# Patient Record
Sex: Male | Born: 1995
Health system: Southern US, Community
[De-identification: ages and names within clinical notes are randomized; demographics above are authoritative.]

## PROBLEM LIST (undated history)

## (undated) DIAGNOSIS — E785 Hyperlipidemia, unspecified: Secondary | ICD-10-CM

## (undated) DIAGNOSIS — Z723 Lack of physical exercise: Secondary | ICD-10-CM

## (undated) HISTORY — DX: Hyperlipidemia, unspecified: E78.5

## (undated) HISTORY — PX: WISDOM TOOTH EXTRACTION: SHX21

## (undated) HISTORY — DX: Lack of physical exercise: Z72.3

---
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
## 2013-05-23 ENCOUNTER — Emergency Department (HOSPITAL_COMMUNITY): Payer: BC Managed Care – PPO

## 2013-05-23 ENCOUNTER — Emergency Department (HOSPITAL_COMMUNITY)
Admission: EM | Admit: 2013-05-23 | Discharge: 2013-05-23 | Disposition: A | Discharge status: Home / Self Care | Payer: BC Managed Care – PPO | Attending: Emergency Medicine | Admitting: Emergency Medicine

## 2013-05-23 ENCOUNTER — Encounter (HOSPITAL_COMMUNITY): Payer: Self-pay | Admitting: Emergency Medicine

## 2013-05-23 DIAGNOSIS — S61011A Laceration without foreign body of right thumb without damage to nail, initial encounter: Secondary | ICD-10-CM

## 2013-05-23 DIAGNOSIS — S61209A Unspecified open wound of unspecified finger without damage to nail, initial encounter: Secondary | ICD-10-CM | POA: Insufficient documentation

## 2013-05-23 DIAGNOSIS — Z23 Encounter for immunization: Secondary | ICD-10-CM | POA: Insufficient documentation

## 2013-05-23 DIAGNOSIS — Y92410 Unspecified street and highway as the place of occurrence of the external cause: Secondary | ICD-10-CM | POA: Insufficient documentation

## 2013-05-23 DIAGNOSIS — Y9389 Activity, other specified: Secondary | ICD-10-CM | POA: Insufficient documentation

## 2013-05-23 MED ORDER — TETANUS-DIPHTH-ACELL PERTUSSIS 5-2.5-18.5 LF-MCG/0.5 IM SUSP
0.5000 mL | Freq: Once | INTRAMUSCULAR | Status: AC
  Administered 2013-05-23: 0.5 mL via INTRAMUSCULAR
  Filled 2013-05-23: qty 0.5

## 2013-05-23 MED ORDER — SODIUM CHLORIDE 0.9 % IV BOLUS (SEPSIS)
1000.0000 mL | Freq: Once | INTRAVENOUS | Status: AC
  Administered 2013-05-23: 1000 mL via INTRAVENOUS

## 2013-05-23 NOTE — ED Notes (Signed)
MD at beside

## 2013-05-23 NOTE — ED Notes (Signed)
MD at bedside. 
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
## 2013-05-24 NOTE — ED Provider Notes (Signed)
Medical screening examination/treatment/procedure(s) were conducted as a shared visit with non-physician practitioner(s) and myself.  I personally evaluated the patient during the encounter.  Please see separate note entered by me which describes my patient encounter.    Brandt Loosen, MD 05/24/13 [phone]

## 2013-05-24 NOTE — ED Provider Notes (Signed)
Medical screening examination/treatment/procedure(s) were performed by non-physician practitioner and as supervising physician I was immediately available for consultation/collaboration.     Brandt Loosen, MD 05/24/13 [phone]
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
## 2017-07-05 ENCOUNTER — Other Ambulatory Visit: Payer: Self-pay

## 2017-07-05 ENCOUNTER — Ambulatory Visit (HOSPITAL_COMMUNITY)
Admission: EM | Admit: 2017-07-05 | Discharge: 2017-07-05 | Disposition: A | Discharge status: Home / Self Care | Payer: BLUE CROSS/BLUE SHIELD | Attending: Family Medicine | Admitting: Family Medicine

## 2017-07-05 ENCOUNTER — Encounter (HOSPITAL_COMMUNITY): Payer: Self-pay

## 2017-07-05 DIAGNOSIS — M791 Myalgia, unspecified site: Secondary | ICD-10-CM | POA: Diagnosis not present

## 2017-07-05 DIAGNOSIS — J029 Acute pharyngitis, unspecified: Secondary | ICD-10-CM | POA: Diagnosis not present

## 2017-07-05 LAB — POCT RAPID STREP A: Streptococcus, Group A Screen (Direct): NEGATIVE

## 2017-07-05 MED ORDER — AMOXICILLIN 875 MG PO TABS: 875.0000 mg | ORAL_TABLET | Freq: Two times a day (BID) | ORAL | 0 refills | Status: DC

## 2017-07-05 NOTE — ED Provider Notes (Signed)
  [facility]   [id number] 07/05/17 Arrival Time: 1808   SUBJECTIVE:  Todd Cherry is a 21 y.o. male who presents to the urgent care with complaint of a flu-like illness with the sore throat and myalgias.  States for the last 3 days he has had a sore throat and started having fever and body aches today. Had a fever of 100 at around 5 today.  No significant rhinorrhea and the symptoms began gradually.   History reviewed. No pertinent past medical history. History reviewed. No pertinent family history. Social History   Socioeconomic History  . Marital status: Single    Spouse name: Not on file  . Number of children: Not on file  . Years of education: Not on file  . Highest education level: Not on file  Social Needs  . Financial resource strain: Not on file  . Food insecurity - worry: Not on file  . Food insecurity - inability: Not on file  . Transportation needs - medical: Not on file  . Transportation needs - non-medical: Not on file  Occupational History  . Not on file  Tobacco Use  . Smoking status: Never Smoker  Substance and Sexual Activity  . Alcohol use: Yes    Comment: unsure of amount   . Drug use: No  . Sexual activity: Not on file  Other Topics Concern  . Not on file  Social History Narrative  . Not on file   No outpatient medications have been marked as taking for the 07/05/17 encounter [facility](Hospital Encounter).   No Known Allergies    ROS: As per HPI, remainder of ROS negative.   OBJECTIVE:   Vitals:   07/05/17 1818  BP: 127/76  Pulse: 87  Resp: 16  Temp: 100.2 F (37.9 C)  TempSrc: Oral  SpO2: 99%     General appearance: alert; no distress Eyes: PERRL; EOMI; conjunctiva normal HENT: normocephalic; atraumatic; TMs normal, canal normal, external ears normal without trauma; nasal mucosa normal; oral mucosa normal Neck: supple Lungs: clear to auscultation bilaterally Heart: regular rate and rhythm Back: no CVA  tenderness Extremities: no cyanosis or edema; symmetrical with no gross deformities Skin: warm and dry Neurologic: normal gait; grossly normal Psychological: alert and cooperative; normal mood and affect    Labs:  Results for orders placed or performed during the hospital encounter of 07/05/17  POCT rapid strep [facility]  Result Value Ref Range   Streptococcus, Group A Screen (Direct) NEGATIVE NEGATIVE    Labs Reviewed  CULTURE, GROUP A STREP [facility])  POCT RAPID STREP A      ASSESSMENT & PLAN:  1. Pharyngitis, unspecified etiology     Meds ordered this encounter  Medications  . amoxicillin (AMOXIL) 875 MG tablet    Sig: Take 1 tablet (875 mg total) by mouth 2 (two) times daily.    Dispense:  20 tablet    Refill:  0    Reviewed expectations re: course of current medical issues. Questions answered. Outlined signs and symptoms indicating need for more acute intervention. Patient verbalized understanding. After Visit Summary given.    Procedures:      Elvina SidleLauenstein, Harlyn Rathmann, MD 07/05/17 [phone]

## 2017-07-05 NOTE — ED Triage Notes (Signed)
Pt presents today with flu like sxs. States for the last 3 days he has had a sore throat and started having fever and body aches today. Had a fever of 100 at around 5 today.

## 2017-07-05 NOTE — Discharge Instructions (Signed)
The initial strep test that we do here in the office is negative.  We going to send out a more accurate throat culture in the meantime I want you to start on the antibiotic.  We will call you once we have the results of the more accurate strep test.

## 2017-07-08 LAB — CULTURE, GROUP A STREP (THRC)

## 2019-03-10 ENCOUNTER — Other Ambulatory Visit: Payer: Self-pay

## 2019-03-10 ENCOUNTER — Ambulatory Visit: Payer: 59 | Admitting: Registered Nurse

## 2019-03-10 ENCOUNTER — Encounter: Payer: Self-pay | Admitting: Registered Nurse

## 2019-03-10 DIAGNOSIS — T63441A Toxic effect of venom of bees, accidental (unintentional), initial encounter: Secondary | ICD-10-CM | POA: Insufficient documentation

## 2019-03-10 MED ORDER — ACETAMINOPHEN 500 MG PO TABS: 1000.0000 mg | ORAL_TABLET | Freq: Four times a day (QID) | ORAL | 0 refills | Status: AC | PRN

## 2019-03-10 MED ORDER — AMOXICILLIN-POT CLAVULANATE 875-125 MG PO TABS: 1.0000 | ORAL_TABLET | Freq: Two times a day (BID) | ORAL | 0 refills | Status: AC

## 2019-03-10 MED ORDER — DIPHENHYDRAMINE HCL 50 MG PO TABS: 50.0000 mg | ORAL_TABLET | Freq: Every evening | ORAL | 0 refills | Status: DC | PRN

## 2019-03-10 MED ORDER — IBUPROFEN 800 MG PO TABS: 800.0000 mg | ORAL_TABLET | Freq: Three times a day (TID) | ORAL | 0 refills | Status: DC

## 2019-03-10 MED ORDER — HYDROCORTISONE 1 % EX LOTN: 1.0000 "application " | TOPICAL_LOTION | Freq: Two times a day (BID) | CUTANEOUS | 0 refills | Status: AC

## 2019-03-10 MED ORDER — DIPHENHYDRAMINE HCL 2 % EX GEL: 1.0000 "application " | Freq: Two times a day (BID) | CUTANEOUS | Status: AC | PRN

## 2019-03-10 MED ORDER — IBUPROFEN 200 MG PO TABS
800.0000 mg | ORAL_TABLET | Freq: Once | ORAL | Status: AC
  Administered 2019-03-10: 16:00:00 800 mg via ORAL

## 2019-03-10 MED ORDER — PREDNISONE 10 MG (21) PO TBPK: ORAL_TABLET | ORAL | 0 refills | Status: DC

## 2019-03-10 MED ORDER — METHYLPREDNISOLONE ACETATE 80 MG/ML IJ SUSP
80.0000 mg | Freq: Once | INTRAMUSCULAR | Status: AC
  Administered 2019-03-10: 16:00:00 80 mg via INTRAMUSCULAR

## 2019-03-10 NOTE — Patient Instructions (Signed)
Cellulitis, Adult  Cellulitis is a skin infection. The infected area is usually warm, red, swollen, and tender. This condition occurs most often in the arms and lower legs. The infection can travel to the muscles, blood, and underlying tissue and become serious. It is very important to get treated for this condition. What are the causes? Cellulitis is caused by bacteria. The bacteria enter through a break in the skin, such as a cut, burn, insect bite, open sore, or crack. What increases the risk? This condition is more likely to occur in people who:  Have a weak body defense system (immune system).  Have open wounds on the skin, such as cuts, burns, bites, and scrapes. Bacteria can enter the body through these open wounds.  Are older than 25 years of age.  Have diabetes.  Have a type of long-lasting (chronic) liver disease (cirrhosis) or kidney disease.  Are obese.  Have a skin condition such as: ? Itchy rash (eczema). ? Slow movement of blood in the veins (venous stasis). ? Fluid buildup below the skin (edema).  Have had radiation therapy.  Use IV drugs. What are the signs or symptoms? Symptoms of this condition include:  Redness, streaking, or spotting on the skin.  Swollen area of the skin.  Tenderness or pain when an area of the skin is touched.  Warm skin.  A fever.  Chills.  Blisters. How is this diagnosed? This condition is diagnosed based on a medical history and physical exam. You may also have tests, including:  Blood tests.  Imaging tests. How is this treated? Treatment for this condition may include:  Medicines, such as antibiotic medicines or medicines to treat allergies (antihistamines).  Supportive care, such as rest and application of cold or warm cloths (compresses) to the skin.  Hospital care, if the condition is severe. The infection usually starts to get better within 1-2 days of treatment. Follow these instructions at home:  Medicines   Take over-the-counter and prescription medicines only as told by your health care provider.  If you were prescribed an antibiotic medicine, take it as told by your health care provider. Do not stop taking the antibiotic even if you start to feel better. General instructions  Drink enough fluid to keep your urine pale yellow.  Do not touch or rub the infected area.  Raise (elevate) the infected area above the level of your heart while you are sitting or lying down.  Apply warm or cold compresses to the affected area as told by your health care provider.  Keep all follow-up visits as told by your health care provider. This is important. These visits let your health care provider make sure a more serious infection is not developing. Contact a health care provider if:  You have a fever.  Your symptoms do not begin to improve within 1-2 days of starting treatment.  Your bone or joint underneath the infected area becomes painful after the skin has healed.  Your infection returns in the same area or another area.  You notice a swollen bump in the infected area.  You develop new symptoms.  You have a general ill feeling (malaise) with muscle aches and pains. Get help right away if:  Your symptoms get worse.  You feel very sleepy.  You develop vomiting or diarrhea that persists.  You notice red streaks coming from the infected area.  Your red area gets larger or turns dark in color. These symptoms may represent a serious problem that is an  emergency. Do not wait to see if the symptoms will go away. Get medical help right away. Call your local emergency services (911 in the [place] Do not drive yourself to the hospital. Summary  Cellulitis is a skin infection. This condition occurs most often in the arms and lower legs.  Treatment for this condition may include medicines, such as antibiotic medicines or antihistamines.  Take over-the-counter and prescription medicines only as  told by your health care provider. If you were prescribed an antibiotic medicine, do not stop taking the antibiotic even if you start to feel better.  Contact a health care provider if your symptoms do not begin to improve within 1-2 days of starting treatment or your symptoms get worse.  Keep all follow-up visits as told by your health care provider. This is important. These visits let your health care provider make sure that a more serious infection is not developing. This information is not intended to replace advice given to you by your health care provider. Make sure you discuss any questions you have with your health care provider. Document Released: 04/10/2005 Document Revised: 11/20/2017 Document Reviewed: 11/20/2017 Elsevier Patient Education  [place] [place], or [employer], Adult Bees, wasps, and hornets are part of a family of insects that can sting people. These stings can cause pain and inflammation, but they are usually not serious. However, some people may have an allergic reaction to a sting. This can cause the symptoms to be more severe. What increases the risk? You may be at a greater risk of getting stung if you:  Provoke a stinging insect by swatting or disturbing it.  Wear strong-smelling soaps, deodorants, or body sprays.  Spend time outdoors near gardens with flowers or fruit trees or in clothes that expose skin.  Eat or drink outside. What are the signs or symptoms? Common symptoms of this condition include:  A red lump in the skin that sometimes has a tiny hole in the center. In some cases, a stinger may be in the center of the wound.  Pain and itching at the sting site.  Redness and swelling around the sting site. If you have an allergic reaction (localized allergic reaction), the swelling and redness may spread out from the sting site. In some cases, this reaction can continue to develop over the next 24-48 hours. In rare cases, a person may have  a severe allergic reaction (anaphylactic reaction) to a sting. Symptoms of an anaphylactic reaction may include:  Wheezing or difficulty breathing.  Raised, itchy, red patches on the skin (hives).  Nausea or vomiting.  Abdominal cramping.  Diarrhea.  Tightness in the chest or chest pain.  Dizziness or fainting.  Redness of the face (flushing).  Hoarse voice.  Swollen tongue, lips, or face. How is this diagnosed? This condition is usually diagnosed based on your symptoms and medical history as well as a physical exam. You may have an allergy test to determine if you are allergic to the substance that the insect injected during the sting (venom). How is this treated? If you were stung by a bee, the stinger and a small sac of venom may be in the wound. It is important to remove the stinger as soon as possible. You can do this by brushing across the wound with gauze, a fingernail, or a flat card such as a credit card. Removing the stinger can help reduce the severity of your body's reaction to the sting. Most stings can be treated with:  Icing to reduce swelling in the area.  Medicines (antihistamines) to treat itching or an allergic reaction.  Medicines to help reduce pain. These may be medicines that you take by mouth, or medicated creams or lotions that you apply to your skin. Pay close attention to your symptoms after you have been stung. If possible, have someone stay with you to make sure you do not have an allergic reaction. If you have any signs of an allergic reaction, call your health care provider. If you have ever had a severe allergic reaction, your health care provider may give you an inhaler or injectable medicine (epinephrine auto-injector) to use if necessary. Follow these instructions at home:   Wash the sting site 2-3 times each day with soap and water as told by your health care provider.  Apply or take over-the-counter and prescription medicines only as told by  your health care provider.  If directed, apply ice to the sting area. ? Put ice in a plastic bag. ? Place a towel between your skin and the bag. ? Leave the ice on for 20 minutes, 2-3 times a day.  Do not scratch the sting area.  If you had a severe allergic reaction to a sting, you may need: ? To wear a medical bracelet or necklace that lists the allergy. ? To learn when and how to use an anaphylaxis kit or epinephrine injection. Your family members and coworkers may also need to learn this. ? To carry an anaphylaxis kit or epinephrine injection with you at all times. How is this prevented?  Avoid swatting at stinging insects and disturbing insect nests.  Do not use fragrant soaps or lotions.  Wear shoes, pants, and long sleeves when spending time outdoors, especially in grassy areas where stinging insects are common.  Keep outdoor areas free from nests or hives.  Keep food and drink containers covered when eating outdoors.  Avoid working or sitting near [employer], if possible.  Wear gloves if you are gardening or working outdoors.  If an attack by a stinging insect or a swarm seems likely in the moment, move away from the area or find a barrier between you and the insect(s), such as a door. Contact a health care provider if:  Your symptoms do not get better in 2-3 days.  You have redness, swelling, or pain that spreads beyond the area of the sting.  You have a fever. Get help right away if: You have symptoms of a severe allergic reaction. These include:  Wheezing or difficulty breathing.  Tightness in the chest or chest pain.  Light-headedness or fainting.  Itchy, raised, red patches on the skin.  Nausea or vomiting.  Abdominal cramping.  Diarrhea.  A swollen tongue or lips, or trouble swallowing.  Dizziness or fainting. Summary  Stings from bees, wasps, and hornets can cause pain and inflammation, but they are usually not serious. However, some  people may have an allergic reaction to a sting. This can cause the symptoms to be more severe.  Pay close attention to your symptoms after you have been stung. If possible, have someone stay with you to make sure you do not have an allergic reaction.  Call your health care provider if you have any signs of an allergic reaction. This information is not intended to replace advice given to you by your health care provider. Make sure you discuss any questions you have with your health care provider. Document Released: 07/01/2005 Document Revised: 06/26/2017 Document Reviewed: 09/05/2016  Elsevier Patient Education  [employer].

## 2019-03-10 NOTE — Progress Notes (Signed)
Subjective:    Patient ID: Todd Cherry, male    DOB: Jan 05, 1996, 23 y.o.   MRN: [id number]  23y/o married caucasian male established to clinic new to me here for evaluation of hornet bites left wrist.  Swollen painful red up to his elbow tried hydrocortisone cream, benadryl, motrin and benadryl gel.  Not sure of mg on motrin or benadryl.  Last dose benadryl last night and motrin this am.   Helped with itching but not swelling/redness.  Denied fever/chills/dyspnea/fatigue/headache/purulent discharge/n/v/d/chest pain/visual changes or tongue swelling.     Review of Systems  Constitutional: Negative for activity change, appetite change, chills, diaphoresis, fatigue and fever.  HENT: Negative for facial swelling, trouble swallowing and voice change.   Eyes: Negative for photophobia and visual disturbance.  Respiratory: Negative for cough, choking, chest tightness, shortness of breath, wheezing and stridor.   Cardiovascular: Negative for chest pain and palpitations.  Gastrointestinal: Negative for abdominal pain, diarrhea, nausea and vomiting.  Endocrine: Negative for cold intolerance and heat intolerance.  Genitourinary: Negative for difficulty urinating.  Musculoskeletal: Negative for arthralgias, back pain, gait problem, joint swelling, myalgias, neck pain and neck stiffness.  Skin: Positive for color change and rash. Negative for pallor and wound.  Allergic/Immunologic: Negative for environmental allergies, food allergies and immunocompromised state.  Neurological: Negative for dizziness, tremors, seizures, syncope, facial asymmetry, speech difficulty, weakness, light-headedness, numbness and headaches.  Hematological: Negative for adenopathy. Does not bruise/bleed easily.  Psychiatric/Behavioral: Negative for agitation, confusion and sleep disturbance.       Objective:   Physical Exam Vitals signs and nursing note reviewed.  Constitutional:      General: He is awake. He is not in  acute distress.    Appearance: Normal appearance. He is well-developed, well-groomed and normal weight. He is not ill-appearing, toxic-appearing or diaphoretic.  HENT:     Head: Normocephalic and atraumatic.     Jaw: There is normal jaw occlusion.     Salivary Glands: Right salivary gland is not diffusely enlarged or tender. Left salivary gland is not diffusely enlarged or tender.     Right Ear: Hearing and external ear normal.     Left Ear: Hearing and external ear normal.     Nose: Nose normal.     Right Sinus: No maxillary sinus tenderness or frontal sinus tenderness.     Left Sinus: No maxillary sinus tenderness or frontal sinus tenderness.     Mouth/Throat:     Lips: Pink. No lesions.     Mouth: Mucous membranes are moist. No injury, lacerations, oral lesions or angioedema.     Tongue: No lesions.     Pharynx: Oropharynx is clear. Uvula midline. No pharyngeal swelling, oropharyngeal exudate, posterior oropharyngeal erythema or uvula swelling.  Eyes:     General: Lids are normal. Vision grossly intact. Gaze aligned appropriately. No visual field deficit or scleral icterus.       Right eye: No discharge.        Left eye: No discharge.     Extraocular Movements: Extraocular movements intact.     Conjunctiva/sclera: Conjunctivae normal.     Pupils: Pupils are equal, round, and reactive to light.  Neck:     Musculoskeletal: Normal range of motion and neck supple. Normal range of motion. No edema, erythema, neck rigidity, crepitus, injury, pain with movement, torticollis, spinous process tenderness or muscular tenderness.     Vascular: No carotid bruit.     Trachea: Trachea and phonation normal. No tracheal tenderness or tracheal  deviation.  Cardiovascular:     Rate and Rhythm: Normal rate and regular rhythm.     Pulses: Normal pulses.          Radial pulses are 2+ on the right side and 2+ on the left side.     Heart sounds: Normal heart sounds.  Pulmonary:     Effort: Pulmonary  effort is normal. No respiratory distress.     Breath sounds: Normal breath sounds and air entry. No stridor, decreased air movement or transmitted upper airway sounds. No decreased breath sounds, wheezing, rhonchi or rales.     Comments: Wearing cloth mask due to covid 19 pandemic; spoke full sentences without difficulty; no cough observed in exam room Chest:     Chest wall: No tenderness.  Abdominal:     General: Abdomen is flat.     Palpations: Abdomen is soft.  Musculoskeletal: Normal range of motion.        General: Swelling and tenderness present. No deformity.     Right shoulder: Normal.     Left shoulder: Normal.     Right elbow: Normal.    Left elbow: Normal.     Right wrist: Normal.     Left wrist: He exhibits tenderness and swelling. He exhibits normal range of motion, no bony tenderness, no effusion, no crepitus, no deformity and no laceration.     Right hip: Normal.     Left hip: Normal.     Right knee: Normal.     Left knee: Normal.     Cervical back: Normal.     Thoracic back: Normal.     Lumbar back: Normal.     Right upper arm: Normal.     Left upper arm: Normal.     Right forearm: Normal.     Left forearm: He exhibits tenderness, swelling and edema. He exhibits no bony tenderness, no deformity and no laceration.       Arms:     Right hand: Normal.     Left hand: Normal. He exhibits normal range of motion, no tenderness, no bony tenderness, normal two-point discrimination, normal capillary refill, no deformity, no laceration and no swelling. Normal sensation noted. Normal strength noted. He exhibits no finger abduction, no thumb/finger opposition and no wrist extension trouble.     Right lower leg: No edema.     Left lower leg: No edema.  Lymphadenopathy:     Head:     Right side of head: No submental, submandibular, tonsillar, preauricular, posterior auricular or occipital adenopathy.     Left side of head: No submental, submandibular, tonsillar, preauricular,  posterior auricular or occipital adenopathy.     Cervical: No cervical adenopathy.     Right cervical: No superficial, deep or posterior cervical adenopathy.    Left cervical: No superficial, deep or posterior cervical adenopathy.  Skin:    General: Skin is warm and dry.     Capillary Refill: Capillary refill takes less than 2 seconds.     Coloration: Skin is not ashen, cyanotic, jaundiced, mottled, pale or sallow.     Findings: Abrasion, erythema and rash present. No abscess, acne, bruising, burn, ecchymosis, laceration, lesion, petechiae or wound. Rash is macular and papular. Rash is not crusting, nodular, purpuric, pustular, scaling, urticarial or vesicular.     Nails: There is no clubbing.   Neurological:     General: No focal deficit present.     Mental Status: He is alert and oriented to person, place, and time. Mental  status is at baseline.     GCS: GCS eye subscore is 4. GCS verbal subscore is 5. GCS motor subscore is 6.     Cranial Nerves: Cranial nerves are intact. No cranial nerve deficit, dysarthria or facial asymmetry.     Sensory: Sensation is intact. No sensory deficit.     Motor: Motor function is intact. No weakness, tremor, atrophy, abnormal muscle tone or seizure activity.     Coordination: Coordination is intact. Coordination normal.     Gait: Gait is intact. Gait normal.     Comments: Gait sure and steady in hallway; on/off exam table and in/out of chair without difficulty; bilateral hand grasp and upper/lower extremity strength equal 5/5  Psychiatric:        Attention and Perception: Attention and perception normal.        Mood and Affect: Mood and affect normal.        Speech: Speech normal.        Behavior: Behavior normal. Behavior is cooperative.        Thought Content: Thought content normal.        Cognition and Memory: Cognition and memory normal.        Judgment: Judgment normal.           Assessment & Plan:  A-insect sting initial  encounter  P-depomedrol 80mg  IM x 1; motrin 800mg  po x 1 in clinic administered by Charm Barges RN.  Patient stayed for 15 minutes after injection to ensure no side effects.  Drank 8 oz water without difficulty.  Has motrin, tylenol, benadryl gel and hydrocortisone cream at home already.  Electronic Rx for prednisone taper 10mg  po daily with breakfast start 03/12/2019 (60/50/40/30/20/10) #21 RF0 discussed possible side effects increased heart rate, BP and insomnia.   augmentin 875mg  po BID x 7-14 days #28 RF0. Electronic Rx to his pharmacy of choice.  May alternate tylenol and motrin if motrin not sufficient for pain/swelling.  Ibuprofen 800mg  po TID prn pain.  Tylenol 1000mg  po QID prn pain.  Call at 7 days to update [place] on symptoms to determine if he requires full 14 day course--Dr Dorris Fetch may require follow up appt.  Discussed Pepcid OTC could also help if swelling not responsive to motrin/steroids/augmentin/benadryl.  If despite 48 hours of antibiotics and steroids pain/rash/redness/worsening or new fever/ discharge he is to be reevaluated by a provider same day.  ER if redness extends to shoulder/trouble breathing/chest pain. Apply benadryl gel twice a day prn itching.  Hydrocortisone 1% lotion apply BID prn itching may alternate with benadryl.  Caution benadryl can cause drowsiness.  Avoid scratching.  May apply ice 15 minutes po TID prn pain/swelling.  Exitcare handout on skin infection and insect bite. RTC if worsening erythema, pain, purulent discharge, fever.  Wash area with soap and water daily do not scrub or be aggressive on affected skin Exitcare handout printed and given to patient on hornet sting and cellulitis.  Patient stated he felt confident can perform  His job duties and rash/swelling doesn't interfere with his ability to perform job.  Patient verbalized understanding, agreed with plan of care and had no further questions at this time.

## 2019-03-10 NOTE — Progress Notes (Signed)
2 black hornets stung left arm near the wrist last night.  Woke up with swelling to the left arm.  States it's progressively worsened throughout the day.  Left arm swollen from wrist to elbow.  Reddened & warm to the touch.  States sore. Took Benadryl last night, Advil this morning & cortisone cream on it today.  AMD

## 2019-05-18 ENCOUNTER — Telehealth: Payer: Managed Care, Other (non HMO) | Admitting: Nurse Practitioner

## 2019-05-18 DIAGNOSIS — J0100 Acute maxillary sinusitis, unspecified: Secondary | ICD-10-CM | POA: Diagnosis not present

## 2019-05-18 MED ORDER — AMOXICILLIN-POT CLAVULANATE 875-125 MG PO TABS: 1.0000 | ORAL_TABLET | Freq: Two times a day (BID) | ORAL | 0 refills | Status: DC

## 2019-05-18 NOTE — Progress Notes (Signed)

## 2019-07-30 ENCOUNTER — Ambulatory Visit: Payer: Managed Care, Other (non HMO) | Attending: Internal Medicine

## 2019-07-30 ENCOUNTER — Other Ambulatory Visit: Payer: Self-pay

## 2019-07-30 DIAGNOSIS — Z20822 Contact with and (suspected) exposure to covid-19: Secondary | ICD-10-CM

## 2019-07-31 LAB — NOVEL CORONAVIRUS, NAA: SARS-CoV-2, NAA: NOT DETECTED

## 2020-02-14 NOTE — Progress Notes (Signed)
Scheduled to complete physical 02/28/20 with Dr. Mayford Knife.  AMD

## 2020-02-15 ENCOUNTER — Other Ambulatory Visit: Payer: Self-pay

## 2020-02-15 ENCOUNTER — Ambulatory Visit: Payer: Self-pay

## 2020-02-15 DIAGNOSIS — Z0000 Encounter for general adult medical examination without abnormal findings: Secondary | ICD-10-CM

## 2020-02-15 LAB — POCT URINALYSIS DIPSTICK
Bilirubin, UA: NEGATIVE
Blood, UA: NEGATIVE
Glucose, UA: NEGATIVE
Ketones, UA: NEGATIVE
Leukocytes, UA: NEGATIVE
Nitrite, UA: NEGATIVE
Protein, UA: NEGATIVE
Spec Grav, UA: 1.01 (ref 1.010–1.025)
Urobilinogen, UA: 0.2 E.U./dL
pH, UA: 6 (ref 5.0–8.0)

## 2020-02-16 LAB — CMP12+LP+TP+TSH+6AC+CBC/D/PLT

## 2020-02-18 ENCOUNTER — Other Ambulatory Visit: Payer: Self-pay

## 2020-02-18 DIAGNOSIS — Z0000 Encounter for general adult medical examination without abnormal findings: Secondary | ICD-10-CM

## 2020-02-19 LAB — CMP12+LP+TP+TSH+6AC+CBC/D/PLT
ALT: 31 IU/L (ref 0–44)
AST: 22 IU/L (ref 0–40)
Albumin/Globulin Ratio: 2.6 — ABNORMAL HIGH (ref 1.2–2.2)
Albumin: 4.9 g/dL (ref 4.1–5.2)
Alkaline Phosphatase: 51 IU/L (ref 48–121)
BUN/Creatinine Ratio: 11 (ref 9–20)
BUN: 11 mg/dL (ref 6–20)
Basophils Absolute: 0.1 10*3/uL (ref 0.0–0.2)
Basos: 1 %
Bilirubin Total: 1.2 mg/dL (ref 0.0–1.2)
Calcium: 9.5 mg/dL (ref 8.7–10.2)
Chloride: 100 mmol/L (ref 96–106)
Chol/HDL Ratio: 6 ratio — ABNORMAL HIGH (ref 0.0–5.0)
Cholesterol, Total: 254 mg/dL — ABNORMAL HIGH (ref 100–199)
Creatinine, Ser: 0.97 mg/dL (ref 0.76–1.27)
EOS (ABSOLUTE): 0.1 10*3/uL (ref 0.0–0.4)
Eos: 1 %
Estimated CHD Risk: 1.3 times avg. — ABNORMAL HIGH (ref 0.0–1.0)
Free Thyroxine Index: 1.6 (ref 1.2–4.9)
GFR calc Af Amer: 124 mL/min/{1.73_m2} (ref >59)
GFR calc non Af Amer: 107 mL/min/{1.73_m2} (ref >59)
GGT: 41 IU/L (ref 0–65)
Globulin, Total: 1.9 g/dL (ref 1.5–4.5)
Glucose: 90 mg/dL (ref 65–99)
HDL: 42 mg/dL (ref >39)
Hematocrit: 45 % (ref 37.5–51.0)
Hemoglobin: 15.6 g/dL (ref 13.0–17.7)
Immature Grans (Abs): 0 10*3/uL (ref 0.0–0.1)
Immature Granulocytes: 1 %
Iron: 159 ug/dL (ref 38–169)
LDH: 151 IU/L (ref 121–224)
LDL Chol Calc (NIH): 185 mg/dL — ABNORMAL HIGH (ref 0–99)
Lymphocytes Absolute: 1.5 10*3/uL (ref 0.7–3.1)
Lymphs: 37 %
MCH: 30.1 pg (ref 26.6–33.0)
MCHC: 34.7 g/dL (ref 31.5–35.7)
MCV: 87 fL (ref 79–97)
Monocytes Absolute: 0.4 10*3/uL (ref 0.1–0.9)
Monocytes: 9 %
Neutrophils Absolute: 2.1 10*3/uL (ref 1.4–7.0)
Neutrophils: 51 %
Phosphorus: 3.6 mg/dL (ref 2.8–4.1)
Platelets: 178 10*3/uL (ref 150–450)
Potassium: 4.4 mmol/L (ref 3.5–5.2)
RBC: 5.19 x10E6/uL (ref 4.14–5.80)
RDW: 12.6 % (ref 11.6–15.4)
Sodium: 137 mmol/L (ref 134–144)
T3 Uptake Ratio: 25 % (ref 24–39)
T4, Total: 6.3 ug/dL (ref 4.5–12.0)
TSH: 1.39 u[IU]/mL (ref 0.450–4.500)
Total Protein: 6.8 g/dL (ref 6.0–8.5)
Triglycerides: 145 mg/dL (ref 0–149)
Uric Acid: 6.6 mg/dL (ref 3.8–8.4)
VLDL Cholesterol Cal: 27 mg/dL (ref 5–40)
WBC: 4 10*3/uL (ref 3.4–10.8)

## 2020-02-28 ENCOUNTER — Encounter: Payer: Self-pay | Admitting: Emergency Medicine

## 2020-02-28 ENCOUNTER — Other Ambulatory Visit: Payer: Self-pay

## 2020-02-28 ENCOUNTER — Ambulatory Visit: Payer: Self-pay | Admitting: Emergency Medicine

## 2020-02-28 VITALS — BP 130/90 | HR 76 | Temp 99.1°F | Resp 14 | Ht 71.0 in | Wt 175.0 lb

## 2020-02-28 DIAGNOSIS — Z0000 Encounter for general adult medical examination without abnormal findings: Secondary | ICD-10-CM

## 2020-02-28 NOTE — Progress Notes (Signed)
  Occupational Health Provider Note       Time seen: 8:47 AM    I have reviewed the vital signs and the nursing notes.  HISTORY   Chief Complaint Annual Exam    HPI Todd Cherry is a 24 y.o. male with a history of hyperlipidemia who presents today for annual physical examination.  Patient denies any complaints, denies any recent illness.  Past Medical History:  Diagnosis Date  . Hyperlipidemia   . Lack of exercise     Past Surgical History:  Procedure Laterality Date  . WISDOM TOOTH EXTRACTION      Allergies Patient has no known allergies.   Review of Systems Constitutional: Negative for fever. Cardiovascular: Negative for chest pain. Respiratory: Negative for shortness of breath. Gastrointestinal: Negative for abdominal pain, vomiting and diarrhea. Musculoskeletal: Negative for back pain. Skin: Negative for rash. Neurological: Negative for headaches, focal weakness or numbness.  All systems negative/normal/unremarkable except as stated in the HPI  ____________________________________________   PHYSICAL EXAM:  VITAL SIGNS: Vitals:   02/28/20 0841  BP: 130/90  Pulse: 76  Resp: 14  Temp: 99.1 F (37.3 C)  SpO2: 96%    Constitutional: Alert and oriented. Well appearing and in no distress. Eyes: Conjunctivae are normal. Normal extraocular movements. ENT      Head: Normocephalic and atraumatic.      Nose: No congestion/rhinnorhea.      Mouth/Throat: Mucous membranes are moist.      Neck: No stridor. Cardiovascular: Normal rate, regular rhythm. No murmurs, rubs, or gallops. Respiratory: Normal respiratory effort without tachypnea nor retractions. Breath sounds are clear and equal bilaterally. No wheezes/rales/rhonchi. Gastrointestinal: Soft and nontender. Normal bowel sounds Musculoskeletal: Nontender with normal range of motion in extremities. No lower extremity tenderness nor edema. Neurologic:  Normal speech and language. No gross focal neurologic  deficits are appreciated.  Skin:  Skin is warm, dry and intact. No rash noted. Psychiatric: Speech and behavior are normal.  ____________________________________________   LABS (pertinent positives/negatives)  Labs revealed hyperlipidemia   ASSESSMENT AND PLAN  Annual physical examination   Plan: The patient had presented for annual physical examination. Patient's labs did indicate high cholesterol.  Blood pressure was also borderline here.  Overall he appears well and has no complaints.  I have advised diet and exercise and will follow up with him in 6 months.  He may need cholesterol medications at that point.  Daryel November MD    Note: This note was generated in part or whole with voice recognition software. Voice recognition is usually quite accurate but there are transcription errors that can and very often do occur. I apologize for any typographical errors that were not detected and corrected.

## 2020-07-06 ENCOUNTER — Ambulatory Visit
Admission: EM | Admit: 2020-07-06 | Discharge: 2020-07-06 | Disposition: A | Discharge status: Home / Self Care | Payer: 59 | Attending: Family Medicine | Admitting: Family Medicine

## 2020-07-06 ENCOUNTER — Encounter: Payer: Self-pay | Admitting: Emergency Medicine

## 2020-07-06 ENCOUNTER — Other Ambulatory Visit: Payer: Self-pay

## 2020-07-06 DIAGNOSIS — Z7689 Persons encountering health services in other specified circumstances: Secondary | ICD-10-CM | POA: Diagnosis not present

## 2020-07-06 DIAGNOSIS — B349 Viral infection, unspecified: Secondary | ICD-10-CM

## 2020-07-06 NOTE — Discharge Instructions (Addendum)
This is most likely some the viral.  Medications over-the-counter as needed.  Covid and flu test pending Follow up as needed for continued or worsening symptoms

## 2020-07-06 NOTE — ED Triage Notes (Signed)
Patient c/o fever, nasal congestion, and body aches x 1 day.   Patient endorses a fever of 102.8 F at home.   Patient has used [employer] w/ some relief of body aches.

## 2020-07-06 NOTE — ED Provider Notes (Signed)
Renaldo Fiddler    CSN: [id number] Arrival date & time: 07/06/20  0940      History   Chief Complaint Chief Complaint  Patient presents with  . Nasal Congestion  . Generalized Body Aches    HPI TREVION HOBEN is a 24 y.o. male.   Patient is a 24 year old male who presents today with nasal congestion, body aches, fever.  This is been present for 1 day.  Fever of 102.8 at home.  Using over-the-counter Alka-Seltzer cold and flu with some relief.  No cough, chest congestion, shortness of breath.     Past Medical History:  Diagnosis Date  . Hyperlipidemia   . Lack of exercise     Patient Active Problem List   Diagnosis Date Noted  . Sting from hornet, wasp, or bee 03/10/2019    Past Surgical History:  Procedure Laterality Date  . WISDOM TOOTH EXTRACTION         Home Medications    Prior to Admission medications   Medication Sig Start Date End Date Taking? Authorizing Provider  diphenhydrAMINE (BENADRYL) 50 MG tablet Take 1 tablet (50 mg total) by mouth at bedtime as needed for up to 7 days for itching. 03/10/19 03/17/19  Betancourt, Jarold Song, NP    Family History Family History  Problem Relation Age of Onset  . Hypertension Father   . Prostate cancer Paternal Grandfather     Social History Social History   Tobacco Use  . Smoking status: Never Smoker  . Smokeless tobacco: Current User    Types: Snuff  Vaping Use  . Vaping Use: Never used  Substance Use Topics  . Alcohol use: Yes    Comment: unsure of amount   . Drug use: No     Allergies   Patient has no known allergies.   Review of Systems Review of Systems   Physical Exam Triage Vital Signs ED Triage Vitals  Enc Vitals Group     BP 07/06/20 0952 120/77     Pulse Rate 07/06/20 0952 77     Resp 07/06/20 0952 17     Temp 07/06/20 0952 99.2 F (37.3 C)     Temp Source 07/06/20 0952 Oral     SpO2 07/06/20 0952 95 %     Weight 07/06/20 0951 175 lb (79.4 kg)     Height 07/06/20 0951  5\' 10"  (1.778 m)     Head Circumference --      Peak Flow --      Pain Score 07/06/20 0951 3     Pain Loc --      Pain Edu? --      Excl. in GC? --    No data found.  Updated Vital Signs BP 120/77 (BP Location: Left Arm)   Pulse 77   Temp 99.2 F (37.3 C) (Oral)   Resp 17   Ht 5\' 10"  (1.778 m)   Wt 175 lb (79.4 kg)   SpO2 95%   BMI 25.11 kg/m   Visual Acuity Right Eye Distance:   Left Eye Distance:   Bilateral Distance:    Right Eye Near:   Left Eye Near:    Bilateral Near:     Physical Exam Vitals and nursing note reviewed.  Constitutional:      General: He is not in acute distress.    Appearance: Normal appearance. He is not ill-appearing, toxic-appearing or diaphoretic.  HENT:     Head: Normocephalic and atraumatic.     Right  Ear: Tympanic membrane and ear canal normal.     Left Ear: Tympanic membrane and ear canal normal.     Nose: Nose normal.     Mouth/Throat:     Pharynx: Oropharynx is clear.  Eyes:     Conjunctiva/sclera: Conjunctivae normal.  Cardiovascular:     Rate and Rhythm: Normal rate and regular rhythm.  Pulmonary:     Effort: Pulmonary effort is normal.     Breath sounds: Normal breath sounds.  Musculoskeletal:        General: Normal range of motion.     Cervical back: Normal range of motion.  Skin:    General: Skin is warm and dry.  Neurological:     Mental Status: He is alert.  Psychiatric:        Mood and Affect: Mood normal.      UC Treatments / Results  Labs (all labs ordered are listed, but only abnormal results are displayed) Labs Reviewed  NOVEL CORONAVIRUS, NAA  COVID-19, FLU A+B NAA    EKG   Radiology No results found.  Procedures Procedures (including critical care time)  Medications Ordered in UC Medications - No data to display  Initial Impression / Assessment and Plan / UC Course  I have reviewed the triage vital signs and the nursing notes.  Pertinent labs & imaging results that were available during  my care of the patient were reviewed by me and considered in my medical decision making (see chart for details).     Viral illness Covid and flu swab pending.  Over-the-counter medicines as needed. Follow up as needed for continued or worsening symptoms  Final Clinical Impressions(s) / UC Diagnoses   Final diagnoses:  Viral illness     Discharge Instructions     This is most likely some the viral.  Medications over-the-counter as needed.  Covid and flu test pending Follow up as needed for continued or worsening symptoms     ED Prescriptions    None     PDMP not reviewed this encounter.   Dahlia Byes A, NP 07/06/20 1020

## 2020-07-12 LAB — COVID-19, FLU A+B NAA
Influenza A, NAA: NOT DETECTED
Influenza B, NAA: NOT DETECTED
SARS-CoV-2, NAA: DETECTED — AB

## 2020-08-23 DIAGNOSIS — Z0000 Encounter for general adult medical examination without abnormal findings: Secondary | ICD-10-CM

## 2020-08-25 NOTE — Progress Notes (Deleted)
Scheduled to complete physical 08/30/2020 with Dr. William Holder.  AMD 

## 2020-08-28 DIAGNOSIS — Z0000 Encounter for general adult medical examination without abnormal findings: Secondary | ICD-10-CM

## 2022-02-07 ENCOUNTER — Ambulatory Visit
Admission: EM | Admit: 2022-02-07 | Discharge: 2022-02-07 | Disposition: A | Discharge status: Home / Self Care | Payer: 59

## 2022-02-07 DIAGNOSIS — B9689 Other specified bacterial agents as the cause of diseases classified elsewhere: Secondary | ICD-10-CM

## 2022-02-07 DIAGNOSIS — M542 Cervicalgia: Secondary | ICD-10-CM | POA: Diagnosis not present

## 2022-02-07 DIAGNOSIS — J069 Acute upper respiratory infection, unspecified: Secondary | ICD-10-CM | POA: Diagnosis not present

## 2022-02-07 MED ORDER — IBUPROFEN 800 MG PO TABS
800.0000 mg | ORAL_TABLET | Freq: Three times a day (TID) | ORAL | 0 refills | Status: DC | PRN
Start: 2022-02-07 — End: 2022-11-04

## 2022-02-07 MED ORDER — PSEUDOEPH-BROMPHEN-DM 30-2-10 MG/5ML PO SYRP: 5.0000 mL | ORAL_SOLUTION | Freq: Four times a day (QID) | ORAL | 0 refills | Status: DC | PRN

## 2022-02-07 MED ORDER — AMOXICILLIN-POT CLAVULANATE 875-125 MG PO TABS: 1.0000 | ORAL_TABLET | Freq: Two times a day (BID) | ORAL | 0 refills | Status: DC

## 2022-02-07 NOTE — Discharge Instructions (Addendum)
Take medication as prescribed. Increase fluids and allow for plenty of rest. Recommend Tylenol or ibuprofen as needed for pain, fever, or general discomfort. Recommend using a humidifier at bedtime during sleep to help with cough and nasal congestion. Sleep elevated on 2 pillows while symptoms persist.  May apply ice or heat to help with neck pain.  Ice is recommended for pain or swelling, heat for spasm or stiffness, apply for 20 minutes, remove for 1 hour, then repeat as needed. Gentle range of motion and stretching exercises to help with neck pain or discomfort. Go to the emergency department immediately if you develop numbness, tingling, or worsening symptoms.  Follow-up with your PCP if your symptoms do not improve.

## 2022-02-07 NOTE — ED Triage Notes (Signed)
Pt reports cough x 3 weeks; fever 101.1 F x 2 days; neck pain middle back pain and chills since lats night. Advil and Tylenol gives some relief with the pain. Pt think he may have Pneumonia.

## 2022-02-07 NOTE — ED Provider Notes (Signed)
[facility]    CSN: [id number] Arrival date & time: 02/07/22  1248      History   Chief Complaint Chief Complaint  Patient presents with   Fever   Cough   Back Pain    HPI Todd Cherry is a 26 y.o. male.   The history is provided by the patient.   Patient with a 3-week history of cough and 2-day history of fever and chills.  He also states that he has developed neck pain today.  He states that his cough has waxed and waned, and thought that he was starting to feel better until the last couple of days when he developed a fever.  Fever has been as high as 101.1.  He denies nasal congestion, runny nose, wheezing, shortness of breath, difficulty breathing, or GI symptoms.  He has been taking Advil and Tylenol for his symptoms.  He also complains of neck pain that started this morning.  He denies any known injury or trauma, numbness, tingling, or radiation of pain.  Pain is located gated on both sides of his neck.  Has pain with rotation of the neck.  He states that a friend told him that he may have meningitis and to get checked out.  He states that he also took Advil for his neck pain with little relief. Past Medical History:  Diagnosis Date   Hyperlipidemia    Lack of exercise     Patient Active Problem List   Diagnosis Date Noted   Sting from hornet, wasp, or bee 03/10/2019    Past Surgical History:  Procedure Laterality Date   WISDOM TOOTH EXTRACTION         Home Medications    Prior to Admission medications   Medication Sig Start Date End Date Taking? Authorizing Provider  acetaminophen (TYLENOL) 500 MG tablet Take 500 mg by mouth every 6 (six) hours as needed.   Yes [provider]  amoxicillin-clavulanate (AUGMENTIN) 875-125 MG tablet Take 1 tablet by mouth every 12 (twelve) hours. 02/07/22  Yes Deshia Vanderhoof-Warren, Alda Lea, NP  brompheniramine-pseudoephedrine-DM 30-2-10 MG/5ML syrup Take 5 mLs by mouth 4 (four) times daily as needed. 02/07/22  Yes  Timo Hartwig-Warren, Alda Lea, NP  ibuprofen (ADVIL) 800 MG tablet Take 1 tablet (800 mg total) by mouth every 8 (eight) hours as needed. 02/07/22  Yes Valma Rotenberg-Warren, Alda Lea, NP  diphenhydrAMINE (BENADRYL) 50 MG tablet Take 1 tablet (50 mg total) by mouth at bedtime as needed for up to 7 days for itching. 03/10/19 03/17/19  Betancourt, Aura Fey, NP    Family History Family History  Problem Relation Age of Onset   Hypertension Father    Prostate cancer Paternal Grandfather     Social History Social History   Tobacco Use   Smoking status: Never   Smokeless tobacco: Current    Types: Snuff  Vaping Use   Vaping Use: Never used  Substance Use Topics   Alcohol use: Yes    Comment: unsure of amount    Drug use: No     Allergies   Patient has no known allergies.   Review of Systems Review of Systems Per HPI  Physical Exam Triage Vital Signs ED Triage Vitals  Enc Vitals Group     BP 02/07/22 1311 130/84     Pulse Rate 02/07/22 1311 61     Resp 02/07/22 1311 17     Temp 02/07/22 1311 98.9 F (37.2 C)     Temp Source 02/07/22 1311  Oral     SpO2 02/07/22 1311 96 %     Weight --      Height --      Head Circumference --      Peak Flow --      Pain Score 02/07/22 1309 5     Pain Loc --      Pain Edu? --      Excl. in GC? --    No data found.  Updated Vital Signs BP 130/84 (BP Location: Right Arm)   Pulse 61   Temp 98.9 F (37.2 C) (Oral)   Resp 17   SpO2 96%   Visual Acuity Right Eye Distance:   Left Eye Distance:   Bilateral Distance:    Right Eye Near:   Left Eye Near:    Bilateral Near:     Physical Exam Vitals and nursing note reviewed.  Constitutional:      General: He is not in acute distress.    Appearance: Normal appearance.  HENT:     Head: Normocephalic.     Right Ear: Tympanic membrane, ear canal and external ear normal.     Left Ear: Tympanic membrane, ear canal and external ear normal.     Nose: Nose normal.     Mouth/Throat:     Mouth:  Mucous membranes are moist.     Pharynx: No oropharyngeal exudate or posterior oropharyngeal erythema.  Eyes:     Extraocular Movements: Extraocular movements intact.     Conjunctiva/sclera: Conjunctivae normal.     Pupils: Pupils are equal, round, and reactive to light.  Cardiovascular:     Rate and Rhythm: Normal rate and regular rhythm.     Pulses: Normal pulses.     Heart sounds: Normal heart sounds.  Pulmonary:     Effort: Pulmonary effort is normal.     Breath sounds: Normal breath sounds.  Abdominal:     General: Bowel sounds are normal.     Palpations: Abdomen is soft.     Tenderness: There is no abdominal tenderness.  Musculoskeletal:     Cervical back: Normal range of motion. No edema, erythema, signs of trauma or rigidity. Pain with movement and muscular tenderness present. No spinous process tenderness. Normal range of motion.  Lymphadenopathy:     Cervical: No cervical adenopathy.  Skin:    General: Skin is warm and dry.  Neurological:     General: No focal deficit present.     Mental Status: He is alert and oriented to person, place, and time.  Psychiatric:        Mood and Affect: Mood normal.        Behavior: Behavior normal.        Thought Content: Thought content normal.        Judgment: Judgment normal.      UC Treatments / Results  Labs (all labs ordered are listed, but only abnormal results are displayed) Labs Reviewed - No data to display  EKG   Radiology No results found.  Procedures Procedures (including critical care time)  Medications Ordered in UC Medications - No data to display  Initial Impression / Assessment and Plan / UC Course  I have reviewed the triage vital signs and the nursing notes.  Pertinent labs & imaging results that were available during my care of the patient were reviewed by me and considered in my medical decision making (see chart for details).  Patient presents with cough that has been present for the past  3  weeks.  Over the past couple of days, patient has developed fever of 101.1.  Patient's vital signs are stable, lung sounds are clear throughout.  Did/flu testing is not indicated at this time as this will not change the course of treatment and also based on the duration of the patient's symptoms.  Because patient has had cough for 3 weeks and has recent development of fever, will treat him for an upper respiratory infection.  Ascertain the cause of his neck pain, but will provide symptomatic treatment, but will provide symptomatic treatment with ibuprofen 800 mg.  Supportive care recommendations were provided for his upper respiratory infection and neck pain.  Patient advised to follow-up if his symptoms do not improve. Final Clinical Impressions(s) / UC Diagnoses   Final diagnoses:  Bacterial upper respiratory infection  Neck pain without injury     Discharge Instructions      Take medication as prescribed. Increase fluids and allow for plenty of rest. Recommend Tylenol or ibuprofen as needed for pain, fever, or general discomfort. Recommend using a humidifier at bedtime during sleep to help with cough and nasal congestion. Sleep elevated on 2 pillows while symptoms persist.  May apply ice or heat to help with neck pain.  Ice is recommended for pain or swelling, heat for spasm or stiffness, apply for 20 minutes, remove for 1 hour, then repeat as needed. Gentle range of motion and stretching exercises to help with neck pain or discomfort. Go to the emergency department immediately if you develop numbness, tingling, or worsening symptoms.  Follow-up with your PCP if your symptoms do not improve.      ED Prescriptions     Medication Sig Dispense Auth. Provider   amoxicillin-clavulanate (AUGMENTIN) 875-125 MG tablet Take 1 tablet by mouth every 12 (twelve) hours. 14 tablet Gwendoline Judy-Warren, Sadie Haber, NP   brompheniramine-pseudoephedrine-DM 30-2-10 MG/5ML syrup Take 5 mLs by mouth 4 (four)  times daily as needed. 140 mL Johnell Landowski-Warren, Sadie Haber, NP   ibuprofen (ADVIL) 800 MG tablet Take 1 tablet (800 mg total) by mouth every 8 (eight) hours as needed. 30 tablet Camella Seim-Warren, Sadie Haber, NP      PDMP not reviewed this encounter.   Abran Cantor, NP 02/07/22 1351

## 2022-09-05 ENCOUNTER — Ambulatory Visit
Admission: RE | Admit: 2022-09-05 | Discharge: 2022-09-05 | Disposition: A | Discharge status: Home / Self Care | Payer: 59 | Source: Ambulatory Visit | Attending: Nurse Practitioner | Admitting: Nurse Practitioner

## 2022-09-05 VITALS — BP 108/70 | HR 75 | Temp 98.6°F | Resp 16

## 2022-09-05 DIAGNOSIS — R509 Fever, unspecified: Secondary | ICD-10-CM | POA: Diagnosis not present

## 2022-09-05 DIAGNOSIS — J0100 Acute maxillary sinusitis, unspecified: Secondary | ICD-10-CM | POA: Diagnosis not present

## 2022-09-05 LAB — POCT RAPID STREP A (OFFICE): Rapid Strep A Screen: NEGATIVE

## 2022-09-05 MED ORDER — FLUTICASONE PROPIONATE 50 MCG/ACT NA SUSP: 2.0000 | Freq: Every day | NASAL | 0 refills | Status: DC

## 2022-09-05 MED ORDER — AMOXICILLIN-POT CLAVULANATE 875-125 MG PO TABS: 1.0000 | ORAL_TABLET | Freq: Two times a day (BID) | ORAL | 0 refills | Status: DC

## 2022-09-05 NOTE — ED Provider Notes (Signed)
[facility]    CSN: [id number] Arrival date & time: 09/05/22  0920      History   Chief Complaint Chief Complaint  Patient presents with   Sore Throat    Was exposed to strep thru my daughter now having signs and symptoms of it like body soreness and high fever and throat a little sore! Also have been battling a sinus infection for a little over a week - Entered by patient   Appointment    0930    HPI Todd Cherry is a 27 y.o. male.   The history is provided by the patient.   The patient presents with a more than 10-day history of sinus symptoms.  Symptoms include head congestion, nasal congestion, sore throat, runny nose, ear pressure, and cough.  He states over the past 24 hours, he developed a fever around 103, sore throat, and bodyaches.  He states that he has taken ibuprofen today for his fever.Marland Kitchen  He states that his daughter was also diagnosed with strep a past several days.  Denies wheezing, shortness of breath, difficulty breathing, abdominal pain, nausea, vomiting, or diarrhea.  Patient states he has been taking over-the-counter cough and cold medications to help with his sinus symptoms.  Past Medical History:  Diagnosis Date   Hyperlipidemia    Lack of exercise     Patient Active Problem List   Diagnosis Date Noted   Sting from hornet, wasp, or bee 03/10/2019    Past Surgical History:  Procedure Laterality Date   WISDOM TOOTH EXTRACTION         Home Medications    Prior to Admission medications   Medication Sig Start Date End Date Taking? Authorizing Provider  amoxicillin-clavulanate (AUGMENTIN) 875-125 MG tablet Take 1 tablet by mouth every 12 (twelve) hours. 09/05/22  Yes Mersades Barbaro-Warren, Alda Lea, NP  fluticasone (FLONASE) 50 MCG/ACT nasal spray Place 2 sprays into both nostrils daily. 09/05/22  Yes Wauneta Silveria-Warren, Alda Lea, NP  acetaminophen (TYLENOL) 500 MG tablet Take 500 mg by mouth every 6 (six) hours as needed.    [provider]   brompheniramine-pseudoephedrine-DM 30-2-10 MG/5ML syrup Take 5 mLs by mouth 4 (four) times daily as needed. 02/07/22   Lyzette Reinhardt-Warren, Alda Lea, NP  diphenhydrAMINE (BENADRYL) 50 MG tablet Take 1 tablet (50 mg total) by mouth at bedtime as needed for up to 7 days for itching. 03/10/19 03/17/19  Betancourt, Aura Fey, NP  ibuprofen (ADVIL) 800 MG tablet Take 1 tablet (800 mg total) by mouth every 8 (eight) hours as needed. 02/07/22   Gaynell Eggleton-Warren, Alda Lea, NP    Family History Family History  Problem Relation Age of Onset   Hypertension Father    Prostate cancer Paternal Grandfather     Social History Social History   Tobacco Use   Smoking status: Never   Smokeless tobacco: Current    Types: Snuff  Vaping Use   Vaping Use: Never used  Substance Use Topics   Alcohol use: Yes    Comment: unsure of amount    Drug use: No     Allergies   Patient has no known allergies.   Review of Systems Review of Systems Per HPI  Physical Exam Triage Vital Signs ED Triage Vitals  Enc Vitals Group     BP 09/05/22 0959 108/70     Pulse Rate 09/05/22 0959 75     Resp 09/05/22 0959 16     Temp 09/05/22 0959 98.6 F (37 C)  Temp Source 09/05/22 0959 Oral     SpO2 09/05/22 0959 96 %     Weight --      Height --      Head Circumference --      Peak Flow --      Pain Score 09/05/22 1002 3     Pain Loc --      Pain Edu? --      Excl. in [place]? --    No data found.  Updated Vital Signs BP 108/70 (BP Location: Right Arm)   Pulse 75   Temp 98.6 F (37 C) (Oral)   Resp 16   SpO2 96%   Visual Acuity Right Eye Distance:   Left Eye Distance:   Bilateral Distance:    Right Eye Near:   Left Eye Near:    Bilateral Near:     Physical Exam Vitals and nursing note reviewed.  Constitutional:      General: He is not in acute distress.    Appearance: He is well-developed.  HENT:     Head: Normocephalic.     Right Ear: Tympanic membrane and ear canal normal.     Left Ear: Tympanic  membrane and ear canal normal.     Nose: Congestion present. No rhinorrhea.     Mouth/Throat:     Mouth: Mucous membranes are moist.     Pharynx: Uvula midline. Posterior oropharyngeal erythema present. No pharyngeal swelling, oropharyngeal exudate or uvula swelling.     Tonsils: No tonsillar exudate.  Eyes:     Conjunctiva/sclera: Conjunctivae normal.     Pupils: Pupils are equal, round, and reactive to light.  Cardiovascular:     Rate and Rhythm: Normal rate and regular rhythm.     Heart sounds: Normal heart sounds.  Pulmonary:     Effort: Pulmonary effort is normal. No respiratory distress.     Breath sounds: Normal breath sounds. No stridor. No wheezing, rhonchi or rales.  Abdominal:     General: Bowel sounds are normal.     Palpations: Abdomen is soft.     Tenderness: There is no abdominal tenderness.  Musculoskeletal:     Cervical back: Normal range of motion.  Lymphadenopathy:     Cervical: No cervical adenopathy.  Skin:    General: Skin is warm and dry.  Neurological:     General: No focal deficit present.     Mental Status: He is alert and oriented to person, place, and time.  Psychiatric:        Mood and Affect: Mood normal.        Behavior: Behavior normal.      UC Treatments / Results  Labs (all labs ordered are listed, but only abnormal results are displayed) Labs Reviewed  CULTURE, GROUP A STREP [facility])  POCT RAPID STREP A (OFFICE)    EKG   Radiology No results found.  Procedures Procedures (including critical care time)  Medications Ordered in UC Medications - No data to display  Initial Impression / Assessment and Plan / UC Course  I have reviewed the triage vital signs and the nursing notes.  Pertinent labs & imaging results that were available during my care of the patient were reviewed by me and considered in my medical decision making (see chart for details).  The patient is well-appearing, he is in no acute distress, vital signs are  stable.  Will treat for acute maxillary sinusitis based on the duration of the patient's symptoms and duration of his symptoms.  Cannot rule out bacterial infection given his recent fever.  Will treat with Augmentin 875/125 mg tablets along with fluticasone 50 micro nasal spray for nasal and head congestion.  Influenza testing was deferred as patient declines antiviral therapy, nor will this change the course of treatment.  Supportive care recommendations were provided to the patient to include increasing fluids, allowing for plenty of rest, over-the-counter analgesics for pain, fever, general discomfort, and warm salt water rinses.  Discussed indications of when follow-up may be indicated.  Patient is in agreement with this plan of care and verbalizes understanding.  All questions were answered.  Patient stable for discharge.   Final Clinical Impressions(s) / UC Diagnoses   Final diagnoses:  Acute maxillary sinusitis, recurrence not specified  Fever, unspecified     Discharge Instructions      The rapid strep test was negative, a throat culture is pending.  You will be contacted if the pending test result is positive. Take medication as prescribed Increase fluids and allow for plenty of rest. Continue Advil may start Tylenol every 8 hours to help with pain, fever, general discomfort. Warm salt water gargles 3-4 times daily to help with throat pain or discomfort. Recommend using a humidifier at nighttime during sleep and sleeping elevated on pillows to help with cough symptoms. If symptoms do not improve with this treatment, please follow-up with your primary care physician for further evaluation. Follow-up as needed.     ED Prescriptions     Medication Sig Dispense Auth. Provider   amoxicillin-clavulanate (AUGMENTIN) 875-125 MG tablet Take 1 tablet by mouth every 12 (twelve) hours. 14 tablet Camil Hausmann-Warren, Alda Lea, NP   fluticasone (FLONASE) 50 MCG/ACT nasal spray Place 2 sprays  into both nostrils daily. 16 g Graeden Bitner-Warren, Alda Lea, NP      PDMP not reviewed this encounter.   Tish Men, NP 09/05/22 1115

## 2022-09-05 NOTE — ED Triage Notes (Signed)
Pt reports fever 103.0 F, sore throat, body aches x 1 day. Sinus pressure x 1 week. Daughter has Strep.

## 2022-09-05 NOTE — Discharge Instructions (Addendum)
The rapid strep test was negative, a throat culture is pending.  You will be contacted if the pending test result is positive. Take medication as prescribed Increase fluids and allow for plenty of rest. Continue Advil may start Tylenol every 8 hours to help with pain, fever, general discomfort. Warm salt water gargles 3-4 times daily to help with throat pain or discomfort. Recommend using a humidifier at nighttime during sleep and sleeping elevated on pillows to help with cough symptoms. If symptoms do not improve with this treatment, please follow-up with your primary care physician for further evaluation. Follow-up as needed.

## 2022-09-08 LAB — CULTURE, GROUP A STREP (THRC)

## 2022-09-20 ENCOUNTER — Ambulatory Visit: Payer: Self-pay

## 2022-09-20 DIAGNOSIS — Z0000 Encounter for general adult medical examination without abnormal findings: Secondary | ICD-10-CM

## 2022-09-20 LAB — POCT URINALYSIS DIPSTICK
Bilirubin, UA: NEGATIVE
Blood, UA: NEGATIVE
Glucose, UA: NEGATIVE
Ketones, UA: NEGATIVE
Leukocytes, UA: NEGATIVE
Nitrite, UA: NEGATIVE
Protein, UA: NEGATIVE
Spec Grav, UA: 1.025 (ref 1.010–1.025)
Urobilinogen, UA: 0.2 E.U./dL
pH, UA: 6 (ref 5.0–8.0)

## 2022-09-20 NOTE — Progress Notes (Signed)
Pt presents today to complete lab portion of physical. /CL,RMA 

## 2022-09-21 LAB — CMP12+LP+TP+TSH+6AC+CBC/D/PLT
ALT: 33 IU/L (ref 0–44)
AST: 21 IU/L (ref 0–40)
Albumin/Globulin Ratio: 2.6 — ABNORMAL HIGH (ref 1.2–2.2)
Albumin: 4.9 g/dL (ref 4.3–5.2)
Alkaline Phosphatase: 57 IU/L (ref 44–121)
BUN/Creatinine Ratio: 13 (ref 9–20)
BUN: 13 mg/dL (ref 6–20)
Basophils Absolute: 0.1 10*3/uL (ref 0.0–0.2)
Basos: 2 %
Bilirubin Total: 0.5 mg/dL (ref 0.0–1.2)
Calcium: 9.8 mg/dL (ref 8.7–10.2)
Chloride: 102 mmol/L (ref 96–106)
Chol/HDL Ratio: 6.1 ratio — ABNORMAL HIGH (ref 0.0–5.0)
Cholesterol, Total: 226 mg/dL — ABNORMAL HIGH (ref 100–199)
Creatinine, Ser: 1 mg/dL (ref 0.76–1.27)
EOS (ABSOLUTE): 0.1 10*3/uL (ref 0.0–0.4)
Eos: 1 %
Estimated CHD Risk: 1.3 times avg. — ABNORMAL HIGH (ref 0.0–1.0)
Free Thyroxine Index: 1.9 (ref 1.2–4.9)
GGT: 36 IU/L (ref 0–65)
Globulin, Total: 1.9 g/dL (ref 1.5–4.5)
Glucose: 83 mg/dL (ref 70–99)
HDL: 37 mg/dL — ABNORMAL LOW (ref >39)
Hematocrit: 42.2 % (ref 37.5–51.0)
Hemoglobin: 14.5 g/dL (ref 13.0–17.7)
Immature Grans (Abs): 0 10*3/uL (ref 0.0–0.1)
Immature Granulocytes: 1 %
Iron: 115 ug/dL (ref 38–169)
LDH: 185 IU/L (ref 121–224)
LDL Chol Calc (NIH): 158 mg/dL — ABNORMAL HIGH (ref 0–99)
Lymphocytes Absolute: 2.1 10*3/uL (ref 0.7–3.1)
Lymphs: 42 %
MCH: 29.4 pg (ref 26.6–33.0)
MCHC: 34.4 g/dL (ref 31.5–35.7)
MCV: 85 fL (ref 79–97)
Monocytes Absolute: 0.3 10*3/uL (ref 0.1–0.9)
Monocytes: 7 %
Neutrophils Absolute: 2.3 10*3/uL (ref 1.4–7.0)
Neutrophils: 47 %
Phosphorus: 3.5 mg/dL (ref 2.8–4.1)
Platelets: 195 10*3/uL (ref 150–450)
Potassium: 4.5 mmol/L (ref 3.5–5.2)
RBC: 4.94 x10E6/uL (ref 4.14–5.80)
RDW: 12.3 % (ref 11.6–15.4)
Sodium: 140 mmol/L (ref 134–144)
T3 Uptake Ratio: 27 % (ref 24–39)
T4, Total: 6.9 ug/dL (ref 4.5–12.0)
TSH: 1.37 u[IU]/mL (ref 0.450–4.500)
Total Protein: 6.8 g/dL (ref 6.0–8.5)
Triglycerides: 167 mg/dL — ABNORMAL HIGH (ref 0–149)
Uric Acid: 5.7 mg/dL (ref 3.8–8.4)
VLDL Cholesterol Cal: 31 mg/dL (ref 5–40)
WBC: 4.9 10*3/uL (ref 3.4–10.8)
eGFR: 105 mL/min/{1.73_m2} (ref >59)

## 2022-09-23 ENCOUNTER — Encounter: Payer: Self-pay | Admitting: Physician Assistant

## 2022-09-23 ENCOUNTER — Ambulatory Visit: Payer: Self-pay | Admitting: Physician Assistant

## 2022-09-23 VITALS — BP 145/95 | HR 68 | Temp 97.7°F | Resp 12 | Ht 71.0 in | Wt 180.0 lb

## 2022-09-23 DIAGNOSIS — Z0000 Encounter for general adult medical examination without abnormal findings: Secondary | ICD-10-CM

## 2022-09-23 MED ORDER — ORPHENADRINE CITRATE ER 100 MG PO TB12: 100.0000 mg | ORAL_TABLET | Freq: Two times a day (BID) | ORAL | 0 refills | Status: DC

## 2022-09-23 MED ORDER — ROSUVASTATIN CALCIUM 40 MG PO TABS: 40.0000 mg | ORAL_TABLET | Freq: Every day | ORAL | 3 refills | Status: DC

## 2022-09-23 MED ORDER — NAPROXEN 500 MG PO TABS: 500.0000 mg | ORAL_TABLET | Freq: Two times a day (BID) | ORAL | Status: DC

## 2022-09-23 NOTE — Progress Notes (Signed)
[facility] ____________________________________________   None    (approximate)  I have reviewed the triage vital signs and the nursing notes.   HISTORY  Chief Complaint Annual Exam   HPI Todd Cherry is a 27 y.o. male that presents for annual physical exam. Reports back pain that radiates to lower abdomen ongoing for 2 months. Labs reviewed with patient during visit.          Past Medical History:  Diagnosis Date   Hyperlipidemia    Lack of exercise     Patient Active Problem List   Diagnosis Date Noted   Sting from hornet, wasp, or bee 03/10/2019    Past Surgical History:  Procedure Laterality Date   WISDOM TOOTH EXTRACTION      Prior to Admission medications   Medication Sig Start Date End Date Taking? Authorizing Provider  fluticasone (FLONASE) 50 MCG/ACT nasal spray Place 2 sprays into both nostrils daily. 09/05/22  Yes Leath-Warren, Alda Lea, NP  naproxen (NAPROSYN) 500 MG tablet Take 1 tablet (500 mg total) by mouth 2 (two) times daily with a meal. 09/23/22  Yes Sable Feil, PA-C  orphenadrine (NORFLEX) 100 MG tablet Take 1 tablet (100 mg total) by mouth 2 (two) times daily. 09/23/22  Yes Sable Feil, PA-C  rosuvastatin (CRESTOR) 40 MG tablet Take 1 tablet (40 mg total) by mouth daily. 09/23/22  Yes Sable Feil, PA-C  ibuprofen (ADVIL) 800 MG tablet Take 1 tablet (800 mg total) by mouth every 8 (eight) hours as needed. Patient not taking: Reported on 09/23/2022 02/07/22   Leath-Warren, Alda Lea, NP    Allergies Patient has no known allergies.  Family History  Problem Relation Age of Onset   Hypertension Father    Prostate cancer Paternal Grandfather     Social History Social History   Tobacco Use   Smoking status: Never   Smokeless tobacco: Current    Types: Snuff  Vaping Use   Vaping Use: Never used  Substance Use Topics   Alcohol use: Yes    Comment: unsure of amount    Drug use: No     Review of Systems Constitutional: No fever/chills Eyes: No visual changes. ENT: No sore throat. Cardiovascular: Denies chest pain. Respiratory: Denies shortness of breath. Gastrointestinal: No abdominal pain.  No nausea, no vomiting.  No diarrhea.  No constipation. Genitourinary: Negative for dysuria. Musculoskeletal: Positive for back pain. Skin: Negative for rash. Neurological: Negative for headaches, focal weakness or numbness.    ____________________________________________   PHYSICAL EXAM:  VITAL SIGNS: BP 145/95, P 68, T 97.5F, SpO2 97%, Wt 180lb, BMI 25.10  Constitutional: Alert and oriented. Well appearing and in no acute distress. Eyes: Conjunctivae are normal. PERRL. EOMI. Head: Atraumatic. Nose: No congestion/rhinnorhea. Mouth/Throat: Mucous membranes are moist.  Oropharynx non-erythematous. Neck: No stridor.  No cervical spine tenderness to palpation Hematological/Lymphatic/Immunilogical: No cervical lymphadenopathy. Cardiovascular: Normal rate, regular rhythm. Grossly normal heart sounds.  Good peripheral circulation. Respiratory: Normal respiratory effort.  No retractions. Lungs CTAB. Gastrointestinal: Soft and nontender. No distention. No abdominal bruits. No CVA tenderness. Genitourinary: Normal  Musculoskeletal: Normal Neurologic:  Normal speech and language. No gross focal neurologic deficits are appreciated. No gait instability. Skin:  Skin is warm, dry and intact. No rash noted. Psychiatric: Mood and affect are normal. Speech and behavior are normal.  ____________________________________________   LABS       Component Ref Range & Units 3 d ago 2 yr ago  Color, UA  Yellow Light Yellow  Clarity, UA  Clear Clear  Glucose, UA Negative Negative Negative  Bilirubin, UA  Negative Negative  Ketones, UA  Negative Negative  Spec Grav, UA 1.010 - 1.025 1.025 1.010  Blood, UA  Negative Negative  pH, UA 5.0 - 8.0 6.0 6.0  Protein, UA Negative Negative  Negative  Urobilinogen, UA 0.2 or 1.0 E.U./dL 0.2 0.2  Nitrite, UA  Negative Negative  Leukocytes, UA Negative Negative Negative  Appearance     Odor                 View All Conversations on this Encounter                          Component Ref Range & Units 3 d ago (09/20/22) 2 yr ago (02/18/20) 2 yr ago (02/15/20)  Glucose 70 - 99 mg/dL 83 90 R CANCELED R, CM  Uric Acid 3.8 - 8.4 mg/dL 5.7 6.6 CM CANCELED R, CM  Comment:            Therapeutic target for gout patients: <6.0  BUN 6 - 20 mg/dL 13 11 CANCELED R, CM  Creatinine, Ser 0.76 - 1.27 mg/dL 1.00 0.97 CANCELED R, CM  eGFR >59 mL/min/1.73 105    BUN/Creatinine Ratio 9 - '20 13 11   '$ Sodium 134 - 144 mmol/L 140 137 CANCELED R, CM  Potassium 3.5 - 5.2 mmol/L 4.5 4.4 CANCELED R, CM  Chloride 96 - 106 mmol/L 102 100 CANCELED R, CM  Calcium 8.7 - 10.2 mg/dL 9.8 9.5 CANCELED R, CM  Phosphorus 2.8 - 4.1 mg/dL 3.5 3.6 CANCELED R, CM  Total Protein 6.0 - 8.5 g/dL 6.8 6.8 CANCELED R, CM  Albumin 4.3 - 5.2 g/dL 4.9 4.9 R CANCELED R, CM  Globulin, Total 1.5 - 4.5 g/dL 1.9 1.9   Albumin/Globulin Ratio 1.2 - 2.2 2.6 High  2.6 High    Bilirubin Total 0.0 - 1.2 mg/dL 0.5 1.2 CANCELED R, CM  Alkaline Phosphatase 44 - 121 IU/L 57 51 R CANCELED R, CM  LDH 121 - 224 IU/L 185 151 CANCELED R, CM  AST 0 - 40 IU/L 21 22 CANCELED R, CM  ALT 0 - 44 IU/L 33 31 CANCELED R, CM  GGT 0 - 65 IU/L 36 41 CANCELED R, CM  Iron 38 - 169 ug/dL 115 159 CANCELED R, CM  Cholesterol, Total 100 - 199 mg/dL 226 High  254 High  CANCELED R, CM  Triglycerides 0 - 149 mg/dL 167 High  145 CANCELED R, CM  HDL >39 mg/dL 37 Low  42 CANCELED R, CM  VLDL Cholesterol Cal 5 - 40 mg/dL 31 27 CANCELED R, CM  LDL Chol Calc (NIH) 0 - 99 mg/dL 158 High  185 High    Chol/HDL Ratio 0.0 - 5.0 ratio 6.1 High  6.0 High  CM   Comment:                                   T. Chol/HDL Ratio                                             Men  Women  1/2  Avg.Risk  3.4    3.3                                   Avg.Risk  5.0    4.4                                2X Avg.Risk  9.6    7.1                                3X Avg.Risk 23.4   11.0  Estimated CHD Risk 0.0 - 1.0 times avg. 1.3 High  1.3 High  CM   Comment: The CHD Risk is based on the T. Chol/HDL ratio. Other factors affect CHD Risk such as hypertension, smoking, diabetes, severe obesity, and family history of premature CHD.  TSH 0.450 - 4.500 uIU/mL 1.370 1.390 CANCELED R, CM  T4, Total 4.5 - 12.0 ug/dL 6.9 6.3 CANCELED R, CM  T3 Uptake Ratio 24 - 39 % 27 25 CANCELED R, CM  Free Thyroxine Index 1.2 - 4.9 1.9 1.6   WBC 3.4 - 10.8 x10E3/uL 4.9 4.0 CANCELED R, CM  RBC 4.14 - 5.80 x10E6/uL 4.94 5.19 CANCELED R, CM  Hemoglobin 13.0 - 17.7 g/dL 14.5 15.6 CANCELED R, CM  Hematocrit 37.5 - 51.0 % 42.2 45.0 CANCELED R, CM  MCV 79 - 97 fL 85 87   MCH 26.6 - 33.0 pg 29.4 30.1   MCHC 31.5 - 35.7 g/dL 34.4 34.7   RDW 11.6 - 15.4 % 12.3 12.6   Platelets 150 - 450 x10E3/uL 195 178 CANCELED R, CM  Neutrophils Not Estab. % 47 51 CANCELED R, CM  Lymphs Not Estab. % 42 37 CANCELED R, CM  Monocytes Not Estab. % 7 9 CANCELED R, CM  Eos Not Estab. % 1 1 CANCELED R, CM  Basos Not Estab. % 2 1   Neutrophils Absolute 1.4 - 7.0 x10E3/uL 2.3 2.1   Lymphocytes Absolute 0.7 - 3.1 x10E3/uL 2.1 1.5 CANCELED R, CM  Monocytes Absolute 0.1 - 0.9 x10E3/uL 0.3 0.4   EOS (ABSOLUTE) 0.0 - 0.4 x10E3/uL 0.1 0.1 CANCELED R, CM  Basophils Absolute 0.0 - 0.2 x10E3/uL 0.1 0.1 CANCELED R, CM  Immature Granulocytes Not Estab. % 1 1   Immature Grans (Abs) 0.0 - 0.1 x10E3/uL 0.0 0.0   Resulting Agency  [facility]                 t      Component Ref Range & Units 3 d ago 2 yr ago  Color, UA  Yellow Light Yellow  Clarity, UA  Clear Clear  Glucose, UA Negative Negative Negative  Bilirubin, UA  Negative Negative  Ketones, UA  Negative Negative  Spec Grav, UA 1.010 - 1.025 1.025 1.010  Blood, UA   Negative Negative  pH, UA 5.0 - 8.0 6.0 6.0  Protein, UA Negative Negative Negative  Urobilinogen, UA 0.2 or 1.0 E.U./dL 0.2 0.2  Nitrite, UA  Negative Negative  Leukocytes, UA Negative Negative Negative  Appearance     Odor               Component Ref Range & Units 3 d ago (09/20/22) 2 yr ago (02/18/20) 2 yr ago (02/15/20)  Glucose 70 - 99 mg/dL  83 90 R CANCELED R, CM  Uric Acid 3.8 - 8.4 mg/dL 5.7 6.6 CM CANCELED R, CM  Comment:            Therapeutic target for gout patients: <6.0  BUN 6 - 20 mg/dL 13 11 CANCELED R, CM  Creatinine, Ser 0.76 - 1.27 mg/dL 1.00 0.97 CANCELED R, CM  eGFR >59 mL/min/1.73 105    BUN/Creatinine Ratio 9 - '20 13 11   '$ Sodium 134 - 144 mmol/L 140 137 CANCELED R, CM  Potassium 3.5 - 5.2 mmol/L 4.5 4.4 CANCELED R, CM  Chloride 96 - 106 mmol/L 102 100 CANCELED R, CM  Calcium 8.7 - 10.2 mg/dL 9.8 9.5 CANCELED R, CM  Phosphorus 2.8 - 4.1 mg/dL 3.5 3.6 CANCELED R, CM  Total Protein 6.0 - 8.5 g/dL 6.8 6.8 CANCELED R, CM  Albumin 4.3 - 5.2 g/dL 4.9 4.9 R CANCELED R, CM  Globulin, Total 1.5 - 4.5 g/dL 1.9 1.9   Albumin/Globulin Ratio 1.2 - 2.2 2.6 High  2.6 High    Bilirubin Total 0.0 - 1.2 mg/dL 0.5 1.2 CANCELED R, CM  Alkaline Phosphatase 44 - 121 IU/L 57 51 R CANCELED R, CM  LDH 121 - 224 IU/L 185 151 CANCELED R, CM  AST 0 - 40 IU/L 21 22 CANCELED R, CM  ALT 0 - 44 IU/L 33 31 CANCELED R, CM  GGT 0 - 65 IU/L 36 41 CANCELED R, CM  Iron 38 - 169 ug/dL 115 159 CANCELED R, CM  Cholesterol, Total 100 - 199 mg/dL 226 High  254 High  CANCELED R, CM  Triglycerides 0 - 149 mg/dL 167 High  145 CANCELED R, CM  HDL >39 mg/dL 37 Low  42 CANCELED R, CM  VLDL Cholesterol Cal 5 - 40 mg/dL 31 27 CANCELED R, CM  LDL Chol Calc (NIH) 0 - 99 mg/dL 158 High  185 High    Chol/HDL Ratio 0.0 - 5.0 ratio 6.1 High  6.0 High  CM   Comment:                                   T. Chol/HDL Ratio                                             Men  Women                               1/2  Avg.Risk  3.4    3.3                                   Avg.Risk  5.0    4.4                                2X Avg.Risk  9.6    7.1                                3X Avg.Risk 23.4   11.0  Estimated CHD Risk 0.0 - 1.0 times avg. 1.3 High  1.3 High  CM   Comment: The CHD Risk is based on the T. Chol/HDL ratio. Other factors affect CHD Risk such as hypertension, smoking, diabetes, severe obesity, and family history of premature CHD.  TSH 0.450 - 4.500 uIU/mL 1.370 1.390 CANCELED R, CM  T4, Total 4.5 - 12.0 ug/dL 6.9 6.3 CANCELED R, CM  T3 Uptake Ratio 24 - 39 % 27 25 CANCELED R, CM  Free Thyroxine Index 1.2 - 4.9 1.9 1.6   WBC 3.4 - 10.8 x10E3/uL 4.9 4.0 CANCELED R, CM  RBC 4.14 - 5.80 x10E6/uL 4.94 5.19 CANCELED R, CM  Hemoglobin 13.0 - 17.7 g/dL 14.5 15.6 CANCELED R, CM  Hematocrit 37.5 - 51.0 % 42.2 45.0 CANCELED R, CM  MCV 79 - 97 fL 85 87   MCH 26.6 - 33.0 pg 29.4 30.1   MCHC 31.5 - 35.7 g/dL 34.4 34.7   RDW 11.6 - 15.4 % 12.3 12.6   Platelets 150 - 450 x10E3/uL 195 178 CANCELED R, CM  Neutrophils Not Estab. % 47 51 CANCELED R, CM  Lymphs Not Estab. % 42 37 CANCELED R, CM  Monocytes Not Estab. % 7 9 CANCELED R, CM  Eos Not Estab. % 1 1 CANCELED R, CM  Basos Not Estab. % 2 1   Neutrophils Absolute 1.4 - 7.0 x10E3/uL 2.3 2.1   Lymphocytes Absolute 0.7 - 3.1 x10E3/uL 2.1 1.5 CANCELED R, CM  Monocytes Absolute 0.1 - 0.9 x10E3/uL 0.3 0.4   EOS (ABSOLUTE) 0.0 - 0.4 x10E3/uL 0.1 0.1 CANCELED R, CM  Basophils Absolute 0.0 - 0.2 x10E3/uL 0.1 0.1 CANCELED R, CM  Immature Granulocytes Not Estab. % 1 1               ____________________________________________      ____________________________________________   INITIAL IMPRESSION / ASSESSMENT AND PLAN  As part of my medical decision making, I reviewed the following data within the [place] reviewed with patient and lipid panel elevation discussed. Plan initiated Crestor and f/u for repeat labs in 3  months. Patient also started on Norflex & naproxen for 5 days for back pain. Instructed to f/u if no resolution to back pain.          ____________________________________________   FINAL CLINICAL IMPRESSION Well exam   ED Discharge Orders          Ordered    rosuvastatin (CRESTOR) 40 MG tablet  Daily        09/23/22 1032    orphenadrine (NORFLEX) 100 MG tablet  2 times daily        09/23/22 1032    naproxen (NAPROSYN) 500 MG tablet  2 times daily with meals        09/23/22 1032             Note:  This document was prepared using Dragon voice recognition software and may include unintentional dictation errors.

## 2022-09-23 NOTE — Progress Notes (Signed)
Pt presents today to complete physical, Pt states he may have pulled a muscle in lower back that radiates around the front of stomach x 2 months.

## 2022-11-04 ENCOUNTER — Ambulatory Visit: Payer: Self-pay | Admitting: Physician Assistant

## 2022-11-04 ENCOUNTER — Encounter: Payer: Self-pay | Admitting: Physician Assistant

## 2022-11-04 VITALS — BP 135/86 | HR 88 | Temp 97.7°F | Resp 14 | Ht 71.0 in | Wt 179.0 lb

## 2022-11-04 DIAGNOSIS — R1032 Left lower quadrant pain: Secondary | ICD-10-CM

## 2022-11-04 DIAGNOSIS — N400 Enlarged prostate without lower urinary tract symptoms: Secondary | ICD-10-CM

## 2022-11-04 NOTE — Progress Notes (Signed)
   Subjective: Pelvic pain    Patient ID: Todd Cherry, male    DOB: [birth year], 27 y.o.   MRN: [id number]  HPI Patient complain of pelvic pain for greater than 4 months.  Patient the pain originates left lower back travels around to the stomach and descends into the testicle area.  No provocative incident for complaint.  No relief with muscle relaxant and anti-inflammatory medications.  Patient was concerned secondary to father diagnosed with prostate cancer.  Review of Systems Negative except for above complaint    Objective:   Physical Exam       BP 135/86  Pulse 88  Resp 14  Temp 97.7 F (36.5 C)  SpO2 97 %  Weight 179 lb (81.2 kg)  Height  (1.803 m)  No obvious lumbar and deformity or guarding with palpation.  No reproducible pelvic pain with palpation.  Patient recently has a physical exam which is unremarkable labs 1 month ago. Assessment & Plan: Pelvic pain  Due to patient history we will perform a PSA before ordering further testing.

## 2022-11-04 NOTE — Progress Notes (Signed)
Stated back pain continues from last appt not resolved.  Stated it's intermittent across the low back, but mostly on the left lower back and it can radiate to the abdomen.  Denies any bowel or bladder difficulties.  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 He says he can handle pain, but wants to make sure it's nothing serious.  At this time pain is level 0, but flairs to about a 5 mostly when bending forward.

## 2022-11-05 ENCOUNTER — Other Ambulatory Visit: Payer: Self-pay | Admitting: Physician Assistant

## 2022-11-05 DIAGNOSIS — R1032 Left lower quadrant pain: Secondary | ICD-10-CM

## 2022-11-05 LAB — PSA, TOTAL AND FREE
PSA, Free Pct: 60 %
PSA, Free: 0.3 ng/mL
Prostate Specific Ag, Serum: 0.5 ng/mL (ref 0.0–4.0)

## 2022-11-05 NOTE — Progress Notes (Signed)
Durward Parcel, PA-C ordered a US Pelvis Limited (Transabdominal Only).  Called Central Scheduling to schedule the procedure & informed this isn't the correct test.  Ron spoke with [employer]  Doesn't want to proceed with a CT scan.  Ron consulted his supervising physician (Dr. Leonard Schwartz on [facility]).  She advised to refer him to Gastroenterology.  Ron called [facility] & explained the change in plan.  He is agreeable.  US Pelvis canceled.  Gastroenterology referral put in Epic.  AMD

## 2022-11-05 NOTE — Addendum Note (Signed)
Addended by: Gardner Candle on: 11/05/2022 02:46 PM   Modules accepted: Orders

## 2022-11-07 ENCOUNTER — Encounter: Payer: Self-pay | Admitting: Emergency Medicine

## 2022-11-07 ENCOUNTER — Other Ambulatory Visit: Payer: 59

## 2022-11-08 ENCOUNTER — Other Ambulatory Visit: Payer: Self-pay | Admitting: Emergency Medicine

## 2022-11-08 ENCOUNTER — Encounter: Payer: Self-pay | Admitting: Emergency Medicine

## 2022-11-08 DIAGNOSIS — R109 Unspecified abdominal pain: Secondary | ICD-10-CM

## 2022-11-12 NOTE — Progress Notes (Unsigned)
Celso Amy, PA-C [address]  [address]  [place], [place] [address]  Main: [phone]  Fax: [phone]   Gastroenterology Consultation  Referring Provider:     Wells Guiles Primary Care Physician:  Patient, No Pcp Per Primary Gastroenterologist:  Celso Amy, PA-C  Reason for Consultation:     Lower Abdominal Pain        HPI:   Todd Cherry is a 27 y.o. y/o male referred for consultation & management  by Nona Dell, PA-C.    Pt. saw Nona Dell PA-C 1-2 months ago for a work physical for his job.  He was having some low back pain and lower abdominal pain for 1.5 months.  CBC, CMP, PSA and TSH were normal.  Urinalysis Normal.  He was referred to GI to further evaluate lower abdominal pain.  Abdominal pain has been located in RLQ and LLQ.  It is worse with sitting and better with standing.  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  He denies rectal bleeding, weight loss, fever, chills, nausea, vomiting, or constipation.  He had episode of diarrhea 4-5 days ago which resolved.  Normal stools now.  No prior hx of GI  problems.  He has not any any abdominal or pelvic Surgeries.  No previous abdominal imaging or GI evaluation.    Past Medical History:  Diagnosis Date   Hyperlipidemia    Lack of exercise     Past Surgical History:  Procedure Laterality Date   WISDOM TOOTH EXTRACTION      Prior to Admission medications   Medication Sig Start Date End Date Taking? Authorizing Provider  fluticasone (FLONASE) 50 MCG/ACT nasal spray Place 2 sprays into both nostrils daily. Patient not taking: Reported on 11/04/2022 09/05/22   Leath-Warren, Sadie Haber, NP  rosuvastatin (CRESTOR) 40 MG tablet Take 1 tablet (40 mg total) by mouth daily. Patient not taking: Reported on 11/04/2022 09/23/22   Joni Reining, PA-C    Family History  Problem Relation Age of Onset   Hypertension Father    Prostate cancer Paternal Grandfather       Social History   Tobacco Use   Smoking status: Never   Smokeless tobacco: Current    Types: Snuff  Vaping Use   Vaping Use: Never used  Substance Use Topics   Alcohol use: Yes    Comment: unsure of amount    Drug use: No    Allergies as of 11/13/2022   (No Known Allergies)    Review of Systems:    All systems reviewed and negative except where noted in HPI.   Physical Exam:  BP 110/71   Pulse (!) 50   Temp 98.2 F (36.8 C)   Ht 5\' 11"  (1.803 m)   Wt 177 lb 3.2 oz (80.4 kg)   BMI 24.71 kg/m  No LMP for male patient. Psych:  Alert and cooperative. Normal mood and affect. General:   Alert,  Well-developed, well-nourished, pleasant and cooperative in NAD Head:  Normocephalic and atraumatic. Eyes:  Sclera clear, no icterus.   Conjunctiva pink. Ears:  Normal auditory acuity. Lungs:  Respirations even and unlabored.  Clear throughout to auscultation.   No wheezes, crackles, or rhonchi. No acute distress. Heart:  Regular rate and rhythm; no murmurs, clicks, rubs, or gallops. Abdomen:  Normal bowel sounds.  No bruits.  Soft, and non-distended without masses, hepatosplenomegaly or hernias noted.  There is mild RLQ and  LLQ tenderness.  No upper abdominal tenderness.  No guarding or rebound tenderness.    Neurologic:  Alert and oriented x3;  grossly normal neurologically. Psych:  Alert and cooperative. Normal mood and affect.  Imaging Studies: No results found.  Assessment and Plan:   Todd Cherry is a 27 y.o. y/o male has been referred for Lower Abdominal Pain for 1 Month.  Also has low back pain.  No previous abdominal surgeries.  Normal CBC, CMP and TSH results.  Negative Urinalysis.  I suspect his lower abdominal pain may be musculoskeletal - referred from low back.  I am ordering abd / pelvic CT to rule out significant pathology to cause lower abd pain.  No prior abd / pelvic surgeries or GI problems.  If CT is normal, then reassurance and refer by to PCP to f/u with back  pain.  He has no worrisome GI symptoms.  1.Lower Abdominal Pain - RLQ, LLQ x 1.5 months - Suspect musculoskeletal.  Ordering Abd / Pelvic CT.  If CT is normal, then reassurance and refer back to PCP to manage low back pain.  Follow up in As needed based on CT results and symptoms.  Celso Amy, PA-C

## 2022-11-13 ENCOUNTER — Encounter: Payer: Self-pay | Admitting: Physician Assistant

## 2022-11-13 ENCOUNTER — Ambulatory Visit (INDEPENDENT_AMBULATORY_CARE_PROVIDER_SITE_OTHER): Payer: 59 | Admitting: Physician Assistant

## 2022-11-13 VITALS — BP 110/71 | HR 50 | Temp 98.2°F | Ht 71.0 in | Wt 177.2 lb

## 2022-11-13 DIAGNOSIS — R1032 Left lower quadrant pain: Secondary | ICD-10-CM

## 2022-11-13 DIAGNOSIS — R1031 Right lower quadrant pain: Secondary | ICD-10-CM | POA: Diagnosis not present

## 2022-11-13 DIAGNOSIS — R1012 Left upper quadrant pain: Secondary | ICD-10-CM | POA: Diagnosis not present

## 2022-11-13 DIAGNOSIS — G8929 Other chronic pain: Secondary | ICD-10-CM

## 2022-11-13 NOTE — Patient Instructions (Addendum)
CT scheduled [facility] 11/15/2022 arrive at 4:15 am. [facility] entrance. Nothing to eat/drink 4 hours prior. We will be in  touch with you regarding the results.

## 2022-11-15 ENCOUNTER — Ambulatory Visit
Admission: RE | Admit: 2022-11-15 | Discharge: 2022-11-15 | Disposition: A | Discharge status: Home / Self Care | Payer: 59 | Source: Ambulatory Visit | Attending: Physician Assistant | Admitting: Physician Assistant

## 2022-11-15 DIAGNOSIS — R1032 Left lower quadrant pain: Secondary | ICD-10-CM | POA: Diagnosis not present

## 2022-11-15 DIAGNOSIS — R109 Unspecified abdominal pain: Secondary | ICD-10-CM | POA: Diagnosis not present

## 2022-11-15 MED ORDER — IOHEXOL 300 MG/ML  SOLN
100.0000 mL | Freq: Once | INTRAMUSCULAR | Status: AC | PRN
  Administered 2022-11-15: 100 mL via INTRAVENOUS

## 2022-11-18 ENCOUNTER — Other Ambulatory Visit: Payer: 59

## 2022-11-19 ENCOUNTER — Telehealth: Payer: Self-pay

## 2022-11-19 NOTE — Telephone Encounter (Signed)
Notify patient abdominal pelvic CT is normal.  No abnormality.  Continue with current plan.  Patient notified,

## 2022-12-25 ENCOUNTER — Other Ambulatory Visit: Payer: Self-pay

## 2023-01-09 ENCOUNTER — Ambulatory Visit: Payer: Self-pay | Admitting: Adult Health

## 2023-01-09 DIAGNOSIS — R222 Localized swelling, mass and lump, trunk: Secondary | ICD-10-CM

## 2023-01-09 NOTE — Progress Notes (Signed)
Pt presents today with small mass near right center chest. PT states it doesn't bother him he only just noticed it. NO pain no swelling stayed the same size if he's not mistaken.

## 2023-01-09 NOTE — Progress Notes (Signed)
[facility] [address], [place] [address]   Office Visit Note  Patient Name: Todd Cherry  [id number]  [id number]  Date of Service: 01/09/2023  Chief Complaint  Patient presents with   Mass          HPI Pt is here for acute visit.  He reports feeling a lump on his chest near his sternum about 6-7 months ago.  He thinks it may have become larger, but he can't say for sure. Denies pain, itching.       Current Medication:  Outpatient Encounter Medications as of 01/09/2023  Medication Sig   rosuvastatin (CRESTOR) 40 MG tablet Take 1 tablet (40 mg total) by mouth daily.   [DISCONTINUED] predniSONE (DELTASONE) 10 MG tablet Take 10 mg by mouth daily with breakfast.   No facility-administered encounter medications on file as of 01/09/2023.      Medical History: Past Medical History:  Diagnosis Date   Hyperlipidemia    Lack of exercise      Vital Signs: There were no vitals taken for this visit.   Review of Systems  Constitutional:  Negative for chills, fatigue and fever.  Skin:        Mass on chest    Physical Exam Vitals and nursing note reviewed.  Constitutional:      Appearance: Normal appearance.  HENT:     Head: Normocephalic.  Chest:       Comments: Mass noted in red area on diagram.  Hard, mildly movable.  Does not feel fluctuant.  Neurological:     Mental Status: He is alert.    Assessment/Plan: 1. Mass of skin of chest See dermatology as discussed.  Would consider general surgery consult if necessary.  - Ambulatory referral to Dermatology     General Counseling: Lerry Liner understanding of the findings of todays visit and agrees with plan of treatment. I have discussed any further diagnostic evaluation that may be needed or ordered today. We also reviewed his medications today. he has been encouraged to call the office with any questions or concerns that should arise related to todays visit.   No orders of the defined  types were placed in this encounter.   No orders of the defined types were placed in this encounter.   Time spent:15 Minutes    Johnna Acosta AGNP-C Nurse Practitioner

## 2023-01-13 DIAGNOSIS — M9901 Segmental and somatic dysfunction of cervical region: Secondary | ICD-10-CM | POA: Diagnosis not present

## 2023-01-13 DIAGNOSIS — M545 Low back pain, unspecified: Secondary | ICD-10-CM | POA: Diagnosis not present

## 2023-01-13 DIAGNOSIS — M9902 Segmental and somatic dysfunction of thoracic region: Secondary | ICD-10-CM | POA: Diagnosis not present

## 2023-01-13 DIAGNOSIS — M9903 Segmental and somatic dysfunction of lumbar region: Secondary | ICD-10-CM | POA: Diagnosis not present

## 2023-01-17 DIAGNOSIS — M545 Low back pain, unspecified: Secondary | ICD-10-CM | POA: Diagnosis not present

## 2023-01-17 DIAGNOSIS — M9902 Segmental and somatic dysfunction of thoracic region: Secondary | ICD-10-CM | POA: Diagnosis not present

## 2023-01-17 DIAGNOSIS — M9903 Segmental and somatic dysfunction of lumbar region: Secondary | ICD-10-CM | POA: Diagnosis not present

## 2023-01-17 DIAGNOSIS — M9901 Segmental and somatic dysfunction of cervical region: Secondary | ICD-10-CM | POA: Diagnosis not present

## 2023-01-20 DIAGNOSIS — M9901 Segmental and somatic dysfunction of cervical region: Secondary | ICD-10-CM | POA: Diagnosis not present

## 2023-01-20 DIAGNOSIS — M9903 Segmental and somatic dysfunction of lumbar region: Secondary | ICD-10-CM | POA: Diagnosis not present

## 2023-01-20 DIAGNOSIS — M9902 Segmental and somatic dysfunction of thoracic region: Secondary | ICD-10-CM | POA: Diagnosis not present

## 2023-01-20 DIAGNOSIS — M545 Low back pain, unspecified: Secondary | ICD-10-CM | POA: Diagnosis not present

## 2023-01-27 DIAGNOSIS — M9901 Segmental and somatic dysfunction of cervical region: Secondary | ICD-10-CM | POA: Diagnosis not present

## 2023-01-27 DIAGNOSIS — M9903 Segmental and somatic dysfunction of lumbar region: Secondary | ICD-10-CM | POA: Diagnosis not present

## 2023-01-27 DIAGNOSIS — M9902 Segmental and somatic dysfunction of thoracic region: Secondary | ICD-10-CM | POA: Diagnosis not present

## 2023-01-27 DIAGNOSIS — M545 Low back pain, unspecified: Secondary | ICD-10-CM | POA: Diagnosis not present

## 2023-01-30 DIAGNOSIS — M545 Low back pain, unspecified: Secondary | ICD-10-CM | POA: Diagnosis not present

## 2023-01-30 DIAGNOSIS — M9902 Segmental and somatic dysfunction of thoracic region: Secondary | ICD-10-CM | POA: Diagnosis not present

## 2023-01-30 DIAGNOSIS — M9903 Segmental and somatic dysfunction of lumbar region: Secondary | ICD-10-CM | POA: Diagnosis not present

## 2023-01-30 DIAGNOSIS — M9901 Segmental and somatic dysfunction of cervical region: Secondary | ICD-10-CM | POA: Diagnosis not present

## 2023-02-03 DIAGNOSIS — M545 Low back pain, unspecified: Secondary | ICD-10-CM | POA: Diagnosis not present

## 2023-02-03 DIAGNOSIS — M9903 Segmental and somatic dysfunction of lumbar region: Secondary | ICD-10-CM | POA: Diagnosis not present

## 2023-02-03 DIAGNOSIS — M9902 Segmental and somatic dysfunction of thoracic region: Secondary | ICD-10-CM | POA: Diagnosis not present

## 2023-02-03 DIAGNOSIS — M9901 Segmental and somatic dysfunction of cervical region: Secondary | ICD-10-CM | POA: Diagnosis not present

## 2023-02-06 DIAGNOSIS — M9901 Segmental and somatic dysfunction of cervical region: Secondary | ICD-10-CM | POA: Diagnosis not present

## 2023-02-06 DIAGNOSIS — M545 Low back pain, unspecified: Secondary | ICD-10-CM | POA: Diagnosis not present

## 2023-02-06 DIAGNOSIS — M9902 Segmental and somatic dysfunction of thoracic region: Secondary | ICD-10-CM | POA: Diagnosis not present

## 2023-02-06 DIAGNOSIS — M9903 Segmental and somatic dysfunction of lumbar region: Secondary | ICD-10-CM | POA: Diagnosis not present

## 2023-02-13 DIAGNOSIS — M545 Low back pain, unspecified: Secondary | ICD-10-CM | POA: Diagnosis not present

## 2023-02-13 DIAGNOSIS — M9902 Segmental and somatic dysfunction of thoracic region: Secondary | ICD-10-CM | POA: Diagnosis not present

## 2023-02-13 DIAGNOSIS — M9901 Segmental and somatic dysfunction of cervical region: Secondary | ICD-10-CM | POA: Diagnosis not present

## 2023-02-13 DIAGNOSIS — M9903 Segmental and somatic dysfunction of lumbar region: Secondary | ICD-10-CM | POA: Diagnosis not present

## 2023-03-03 DIAGNOSIS — M9901 Segmental and somatic dysfunction of cervical region: Secondary | ICD-10-CM | POA: Diagnosis not present

## 2023-03-03 DIAGNOSIS — M9903 Segmental and somatic dysfunction of lumbar region: Secondary | ICD-10-CM | POA: Diagnosis not present

## 2023-03-03 DIAGNOSIS — M9902 Segmental and somatic dysfunction of thoracic region: Secondary | ICD-10-CM | POA: Diagnosis not present

## 2023-03-03 DIAGNOSIS — M545 Low back pain, unspecified: Secondary | ICD-10-CM | POA: Diagnosis not present

## 2023-06-09 DIAGNOSIS — L720 Epidermal cyst: Secondary | ICD-10-CM | POA: Diagnosis not present

## 2023-07-26 ENCOUNTER — Ambulatory Visit
Admission: RE | Admit: 2023-07-26 | Discharge: 2023-07-26 | Disposition: A | Discharge status: Home / Self Care | Payer: 59 | Source: Ambulatory Visit | Attending: Family Medicine | Admitting: Family Medicine

## 2023-07-26 VITALS — BP 137/98 | HR 114 | Temp 98.4°F | Resp 18

## 2023-07-26 DIAGNOSIS — J0100 Acute maxillary sinusitis, unspecified: Secondary | ICD-10-CM

## 2023-07-26 DIAGNOSIS — H66012 Acute suppurative otitis media with spontaneous rupture of ear drum, left ear: Secondary | ICD-10-CM | POA: Diagnosis not present

## 2023-07-26 MED ORDER — CIPROFLOXACIN-DEXAMETHASONE 0.3-0.1 % OT SUSP: 4.0000 [drp] | Freq: Two times a day (BID) | OTIC | 0 refills | Status: DC

## 2023-07-26 MED ORDER — AMOXICILLIN-POT CLAVULANATE 875-125 MG PO TABS: 1.0000 | ORAL_TABLET | Freq: Two times a day (BID) | ORAL | 0 refills | Status: DC

## 2023-07-26 NOTE — ED Provider Notes (Signed)
 [facility]    CSN: [id number] Arrival date & time: 07/26/23  1026      History   Chief Complaint Chief Complaint  Patient presents with   Ear Fullness    I've had the flu and I've been working on beating that and then yesterday Todd ear starting hurting and now it's uncontrollable hurt! I'm pretty sure I have an ear infection! - Entered by patient    HPI Todd Cherry is a 28 y.o. male.   Patient resenting today with 3-day history of progressively worsening sharp stabbing left ear pain now with clear drainage from the ear.  Had the flu last week and has had some sinus issues for the past few weeks that he notes have also been worsening over the last few days.  Denies cough, chest pain, shortness of breath, fever, chills, body aches.  Trying Alka-Seltzer cold and sinus, Sudafed, over-the-counter pain relievers with minimal relief.    Past Medical History:  Diagnosis Date   Hyperlipidemia    Lack of exercise     Patient Active Problem List   Diagnosis Date Noted   Sting from hornet, wasp, or bee 03/10/2019    Past Surgical History:  Procedure Laterality Date   WISDOM TOOTH EXTRACTION         Home Medications    Prior to Admission medications   Medication Sig Start Date End Date Taking? Authorizing Provider  amoxicillin -clavulanate (AUGMENTIN ) 875-125 MG tablet Take 1 tablet by mouth every 12 (twelve) hours. 07/26/23  Yes Stuart Vernell Norris, PA-C  ciprofloxacin -dexamethasone  (CIPRODEX ) OTIC suspension Place 4 drops into the left ear 2 (two) times daily. 07/26/23  Yes Stuart Vernell Norris, PA-C  rosuvastatin  (CRESTOR ) 40 MG tablet Take 1 tablet (40 mg total) by mouth daily. 09/23/22   Claudene Tanda POUR, PA-C    Family History Family History  Problem Relation Age of Onset   Hypertension Father    Prostate cancer Paternal Grandfather     Social History Social History   Tobacco Use   Smoking status: Never   Smokeless tobacco: Current    Types:  Snuff  Vaping Use   Vaping status: Never Used  Substance Use Topics   Alcohol use: Yes    Comment: unsure of amount    Drug use: No     Allergies   Patient has no known allergies.   Review of Systems Review of Systems Per HPI  Physical Exam Triage Vital Signs ED Triage Vitals  Encounter Vitals Group     BP 07/26/23 1034 (!) 137/98     Systolic BP Percentile --      Diastolic BP Percentile --      Pulse Rate 07/26/23 1034 (!) 114     Resp 07/26/23 1034 18     Temp 07/26/23 1034 98.4 F (36.9 C)     Temp Source 07/26/23 1034 Oral     SpO2 07/26/23 1034 96 %     Weight --      Height --      Head Circumference --      Peak Flow --      Pain Score 07/26/23 1037 9     Pain Loc --      Pain Education --      Exclude from Growth Chart --    No data found.  Updated Vital Signs BP (!) 137/98 (BP Location: Right Arm)   Pulse (!) 114   Temp 98.4 F (36.9 C) (Oral)  Resp 18   SpO2 96%   Visual Acuity Right Eye Distance:   Left Eye Distance:   Bilateral Distance:    Right Eye Near:   Left Eye Near:    Bilateral Near:     Physical Exam Vitals and nursing note reviewed.  Constitutional:      Appearance: He is well-developed.  HENT:     Head: Atraumatic.     Right Ear: Tympanic membrane and external ear normal.     Left Ear: External ear normal.     Ears:     Comments: Left TM ruptured, yellow drainage present in EAC    Nose: Congestion present.     Mouth/Throat:     Mouth: Mucous membranes are moist.     Pharynx: Oropharynx is clear. No oropharyngeal exudate.  Eyes:     Conjunctiva/sclera: Conjunctivae normal.     Pupils: Pupils are equal, round, and reactive to light.  Cardiovascular:     Rate and Rhythm: Normal rate and regular rhythm.  Pulmonary:     Effort: Pulmonary effort is normal. No respiratory distress.     Breath sounds: No wheezing or rales.  Musculoskeletal:        General: Normal range of motion.     Cervical back: Normal range of  motion and neck supple.  Lymphadenopathy:     Cervical: No cervical adenopathy.  Skin:    General: Skin is warm and dry.  Neurological:     Mental Status: He is alert and oriented to person, place, and time.     Motor: No weakness.     Gait: Gait normal.  Psychiatric:        Behavior: Behavior normal.      UC Treatments / Results  Labs (all labs ordered are listed, but only abnormal results are displayed) Labs Reviewed - No data to display  EKG   Radiology No results found.  Procedures Procedures (including critical care time)  Medications Ordered in UC Medications - No data to display  Initial Impression / Assessment and Plan / UC Course  I have reviewed the triage vital signs and the nursing notes.  Pertinent labs & imaging results that were available during Todd care of the patient were reviewed by me and considered in Todd medical decision making (see chart for details).     Treat with Augmentin , Ciprodex  drops, over-the-counter decongestants, pain relievers as needed.  Follow-up with PCP for recheck.  Final Clinical Impressions(s) / UC Diagnoses   Final diagnoses:  Non-recurrent acute suppurative otitis media of left ear with spontaneous rupture of tympanic membrane  Acute non-recurrent maxillary sinusitis   Discharge Instructions   None    ED Prescriptions     Medication Sig Dispense Auth. Provider   ciprofloxacin -dexamethasone  (CIPRODEX ) OTIC suspension Place 4 drops into the left ear 2 (two) times daily. 7.5 mL Stuart Vernell Norris, PA-C   amoxicillin -clavulanate (AUGMENTIN ) 875-125 MG tablet Take 1 tablet by mouth every 12 (twelve) hours. 14 tablet Stuart Vernell Norris, [place]      PDMP not reviewed this encounter.   Stuart Vernell Norris, PA-C 07/26/23 1219

## 2023-07-26 NOTE — ED Triage Notes (Signed)
 Pt reports he cannot hear out of his left ear, left ear pain, and [place] mucus out of his now x 3 days

## 2023-07-28 ENCOUNTER — Encounter: Payer: 59 | Admitting: Physician Assistant

## 2023-11-13 ENCOUNTER — Ambulatory Visit: Payer: Self-pay

## 2023-11-13 DIAGNOSIS — Z0000 Encounter for general adult medical examination without abnormal findings: Secondary | ICD-10-CM

## 2023-11-13 LAB — POCT URINALYSIS DIPSTICK
Bilirubin, UA: NEGATIVE
Blood, UA: NEGATIVE
Glucose, UA: NEGATIVE
Ketones, UA: NEGATIVE
Leukocytes, UA: NEGATIVE
Nitrite, UA: NEGATIVE
Protein, UA: NEGATIVE
Spec Grav, UA: 1.015 (ref 1.010–1.025)
Urobilinogen, UA: 0.2 U/dL
pH, UA: 6 (ref 5.0–8.0)

## 2023-11-15 LAB — CMP12+LP+TP+TSH+6AC+CBC/D/PLT
ALT: 29 IU/L (ref 0–44)
AST: 17 IU/L (ref 0–40)
Albumin: 5 g/dL (ref 4.3–5.2)
Alkaline Phosphatase: 55 IU/L (ref 44–121)
BUN/Creatinine Ratio: 13 (ref 9–20)
BUN: 14 mg/dL (ref 6–20)
Basophils Absolute: 0 10*3/uL (ref 0.0–0.2)
Basos: 1 %
Bilirubin Total: 0.9 mg/dL (ref 0.0–1.2)
Calcium: 9.6 mg/dL (ref 8.7–10.2)
Chloride: 101 mmol/L (ref 96–106)
Chol/HDL Ratio: 6.3 ratio — ABNORMAL HIGH (ref 0.0–5.0)
Cholesterol, Total: 264 mg/dL — ABNORMAL HIGH (ref 100–199)
Creatinine, Ser: 1.06 mg/dL (ref 0.76–1.27)
EOS (ABSOLUTE): 0 10*3/uL (ref 0.0–0.4)
Eos: 1 %
Estimated CHD Risk: 1.3 times avg. — ABNORMAL HIGH (ref 0.0–1.0)
Free Thyroxine Index: 1.7 (ref 1.2–4.9)
GGT: 38 IU/L (ref 0–65)
Globulin, Total: 1.9 g/dL (ref 1.5–4.5)
Glucose: 88 mg/dL (ref 70–99)
HDL: 42 mg/dL (ref >39)
Hematocrit: 43.6 % (ref 37.5–51.0)
Hemoglobin: 15.4 g/dL (ref 13.0–17.7)
Immature Grans (Abs): 0 10*3/uL (ref 0.0–0.1)
Immature Granulocytes: 0 %
Iron: 114 ug/dL (ref 38–169)
LDH: 159 IU/L (ref 121–224)
LDL Chol Calc (NIH): 206 mg/dL — ABNORMAL HIGH (ref 0–99)
Lymphocytes Absolute: 1.9 10*3/uL (ref 0.7–3.1)
Lymphs: 43 %
MCH: 30.7 pg (ref 26.6–33.0)
MCHC: 35.3 g/dL (ref 31.5–35.7)
MCV: 87 fL (ref 79–97)
Monocytes Absolute: 0.3 10*3/uL (ref 0.1–0.9)
Monocytes: 8 %
Neutrophils Absolute: 2.1 10*3/uL (ref 1.4–7.0)
Neutrophils: 47 %
Phosphorus: 3.4 mg/dL (ref 2.8–4.1)
Platelets: 195 10*3/uL (ref 150–450)
Potassium: 4.5 mmol/L (ref 3.5–5.2)
RBC: 5.02 x10E6/uL (ref 4.14–5.80)
RDW: 12.7 % (ref 11.6–15.4)
Sodium: 140 mmol/L (ref 134–144)
T3 Uptake Ratio: 28 % (ref 24–39)
T4, Total: 6.2 ug/dL (ref 4.5–12.0)
TSH: 1.12 u[IU]/mL (ref 0.450–4.500)
Total Protein: 6.9 g/dL (ref 6.0–8.5)
Triglycerides: 91 mg/dL (ref 0–149)
Uric Acid: 6.2 mg/dL (ref 3.8–8.4)
VLDL Cholesterol Cal: 16 mg/dL (ref 5–40)
WBC: 4.4 10*3/uL (ref 3.4–10.8)
eGFR: 97 mL/min/{1.73_m2} (ref >59)

## 2023-11-17 ENCOUNTER — Ambulatory Visit: Payer: Self-pay | Admitting: Physician Assistant

## 2023-11-17 ENCOUNTER — Other Ambulatory Visit: Payer: Self-pay | Admitting: Physician Assistant

## 2023-11-17 ENCOUNTER — Encounter: Payer: Self-pay | Admitting: Physician Assistant

## 2023-11-17 VITALS — BP 143/96 | Temp 98.6°F | Resp 14 | Ht 71.0 in | Wt 180.0 lb

## 2023-11-17 DIAGNOSIS — Z0000 Encounter for general adult medical examination without abnormal findings: Secondary | ICD-10-CM

## 2023-11-17 MED ORDER — SIMVASTATIN 20 MG PO TABS: 20.0000 mg | ORAL_TABLET | Freq: Every day | ORAL | 0 refills | Status: AC

## 2023-11-17 NOTE — Progress Notes (Signed)
 [facility] { ____________________________________________   None    (approximate)  I have reviewed the triage vital signs and the nursing notes.   HISTORY  Chief Complaint Annual Exam  HPI Todd Cherry is a 28 y.o. male patient presents annual physical exam.  Patient voiced no concerns or complaints.         Past Medical History:  Diagnosis Date   Hyperlipidemia    Lack of exercise     Patient Active Problem List   Diagnosis Date Noted   Sting from hornet, wasp, or bee 03/10/2019    Past Surgical History:  Procedure Laterality Date   WISDOM TOOTH EXTRACTION      Prior to Admission medications   Medication Sig Start Date End Date Taking? Authorizing Provider  rosuvastatin  (CRESTOR ) 40 MG tablet Take 1 tablet (40 mg total) by mouth daily. Patient not taking: Reported on 11/17/2023 09/23/22   Marcina Severe, PA-C    Allergies Patient has no known allergies.  Family History  Problem Relation Age of Onset   Hypertension Father    Prostate cancer Paternal Grandfather     Social History Social History   Tobacco Use   Smoking status: Never   Smokeless tobacco: Current    Types: Snuff  Vaping Use   Vaping status: Never Used  Substance Use Topics   Alcohol use: Yes    Comment: unsure of amount    Drug use: No    Review of Systems Constitutional: No fever/chills Eyes: No visual changes. ENT: No sore throat. Cardiovascular: Denies chest pain. Respiratory: Denies shortness of breath. Gastrointestinal: No abdominal pain.  No nausea, no vomiting.  No diarrhea.  No constipation. Genitourinary: Negative for dysuria. Musculoskeletal: Negative for back pain. Skin: Negative for rash. Neurological: Negative for headaches, focal weakness or numbness.  Endocrine: Hyperlipidemia ____________________________________________   PHYSICAL EXAM:  VITAL SIGNS: BP 143/96BP. 143/96. Data is abnormal. Taken on 11/17/23 8:56 AM   Cuff Size Normal  Temp 98.6 F (37 C)  Temp Source Temporal  Weight 180 lb (81.6 kg)  Height 5\' 11"  (1.803 m)  Resp 14  SpO2 97 %   BMI: 25.10 kg/m2  BSA: 2.02 m2   Constitutional: Alert and oriented. Well appearing and in no acute distress. Eyes: Conjunctivae are normal. PERRL. EOMI. Head: Atraumatic. Nose: No congestion/rhinnorhea. Mouth/Throat: Mucous membranes are moist.  Oropharynx non-erythematous. Neck: No stridor. No cervical spine tenderness to palpation. Hematological/Lymphatic/Immunilogical: No cervical lymphadenopathy. Cardiovascular: Normal rate, regular rhythm. Grossly normal heart sounds.  Good peripheral circulation. Respiratory: Normal respiratory effort.  No retractions. Lungs CTAB. Gastrointestinal: Soft and nontender. No distention. No abdominal bruits. No CVA tenderness. Genitourinary: Deferred Musculoskeletal: No lower extremity tenderness nor edema.  No joint effusions. Neurologic:  Normal speech and language. No gross focal neurologic deficits are appreciated. No gait instability. Skin:  Skin is warm, dry and intact. No rash noted. Psychiatric: Mood and affect are normal. Speech and behavior are normal.  ____________________________________________   LABS        Component Ref Range & Units (hover) 4 d ago (11/13/23) 1 yr ago (09/20/22) 3 yr ago (02/18/20) 3 yr ago (02/15/20)  Glucose 88 83 90 R CANCELED R, CM  Uric Acid 6.2 5.7 CM 6.6 CM CANCELED R, CM  Comment:            Therapeutic target for gout patients: <6.0  BUN 14 13 11  CANCELED R, CM  Creatinine, Ser 1.06 1.00 0.97 CANCELED R, CM  eGFR 97 105    BUN/Creatinine Ratio 13 13 11    Sodium 140 140 137 CANCELED R, CM  Potassium 4.5 4.5 4.4 CANCELED R, CM  Chloride 101 102 100 CANCELED R, CM  Calcium  9.6 9.8 9.5 CANCELED R, CM  Phosphorus 3.4 3.5 3.6 CANCELED R, CM  Total Protein 6.9 6.8 6.8 CANCELED R, CM  Albumin 5.0 4.9 4.9 R CANCELED R, CM  Globulin, Total 1.9 1.9 1.9   Bilirubin Total 0.9  0.5 1.2 CANCELED R, CM  Alkaline Phosphatase 55 57 51 R CANCELED R, CM  LDH 159 185 151 CANCELED R, CM  AST 17 21 22  CANCELED R, CM  ALT 29 33 31 CANCELED R, CM  GGT 38 36 41 CANCELED R, CM  Iron 114 115 159 CANCELED R, CM  Cholesterol, Total 264 High  226 High  254 High  CANCELED R, CM  Triglycerides 91 167 High  145 CANCELED R, CM  HDL 42 37 Low  42 CANCELED R, CM  VLDL Cholesterol Cal 16 31 27  CANCELED R, CM  LDL Chol Calc (NIH) 206 High  158 High  185 High    LDL CALC COMMENT: Comment     Comment: Consider evaluating for Familial Hypercholesterolemia(FH), if clinically indicated.  Chol/HDL Ratio 6.3 High  6.1 High  CM 6.0 High  CM   Comment:                                   T. Chol/HDL Ratio                                             Men  Women                               1/2 Avg.Risk  3.4    3.3                                   Avg.Risk  5.0    4.4                                2X Avg.Risk  9.6    7.1                                3X Avg.Risk 23.4   11.0  Estimated CHD Risk 1.3 High  1.3 High  CM 1.3 High  CM   Comment: The CHD Risk is based on the T. Chol/HDL ratio. Other factors affect CHD Risk such as hypertension, smoking, diabetes, severe obesity, and family history of premature CHD.  TSH 1.120 1.370 1.390 CANCELED R, CM  T4, Total 6.2 6.9 6.3 CANCELED R, CM  T3 Uptake Ratio 28 27 25  CANCELED R, CM  Free Thyroxine Index 1.7 1.9 1.6   WBC 4.4 4.9 4.0 CANCELED R, CM  RBC 5.02 4.94 5.19 CANCELED R, CM  Hemoglobin 15.4 14.5 15.6 CANCELED R, CM  Hematocrit 43.6 42.2 45.0 CANCELED R, CM  MCV 87 85 87   MCH 30.7 29.4 30.1   MCHC 35.3 34.4  34.7   RDW 12.7 12.3 12.6   Platelets 195 195 178 CANCELED R, CM  Neutrophils 47 47 51 CANCELED R, CM  Lymphs 43 42 37 CANCELED R, CM  Monocytes 8 7 9  CANCELED R, CM  Eos 1 1 1  CANCELED R, CM  Basos 1 2 1    Neutrophils Absolute 2.1 2.3 2.1   Lymphocytes Absolute 1.9 2.1 1.5 CANCELED R, CM  Monocytes Absolute 0.3 0.3 0.4    EOS (ABSOLUTE) 0.0 0.1 0.1 CANCELED R, CM  Basophils Absolute 0.0 0.1 0.1 CANCELED R, CM  Immature Granulocytes 0 1 1   Immature Grans (Abs) 0.0 0.0 0.0   Resulting Agency [facility]          View All Conversations on this Encounter           Component Ref Range & Units (hover) 4 d ago 1 yr ago 3 yr ago  Color, UA Yellow Yellow Light Yellow  Clarity, UA Clear Clear Clear  Glucose, UA Negative Negative Negative  Bilirubin, UA Negative Negative Negative  Ketones, UA Negative Negative Negative  Spec Grav, UA 1.015 1.025 1.010  Blood, UA Negative Negative Negative  pH, UA 6.0 6.0 6.0  Protein, UA Negative Negative Negative  Urobilinogen, UA 0.2 0.2 0.2  Nitrite, UA Negative Negative Negative  Leukocytes, UA Negative Negative Negative  Appearance     Odor               ____________________________________________   INITIAL IMPRESSION / ASSESSMENT AND PLAN  As part of my medical decision making, I reviewed the following data within the electronic MEDICAL RECORD NUMBER Notes from prior ED visits and [place] Controlled Substance Database     No acute findings on physical exam.  Patient labs show elevated cholesterol.  Patient is amenable to starting statins and follow-up in 3 months.        ____________________________________________   FINAL CLINICAL IMPRESSION   ED Discharge Orders     None        Note:  This document was prepared using Dragon voice recognition software and may include unintentional dictation errors.

## 2023-11-17 NOTE — Progress Notes (Signed)
 Pt presents today to complete physical, Pt denies any issues or concerns at this time/CL,RMA

## 2024-01-21 ENCOUNTER — Ambulatory Visit: Payer: Self-pay | Admitting: Physician Assistant

## 2024-01-21 ENCOUNTER — Encounter: Payer: Self-pay | Admitting: Physician Assistant

## 2024-01-21 VITALS — BP 109/82 | HR 62 | Temp 97.8°F | Resp 14 | Wt 180.0 lb

## 2024-01-21 DIAGNOSIS — Z0000 Encounter for general adult medical examination without abnormal findings: Secondary | ICD-10-CM

## 2024-01-21 DIAGNOSIS — R12 Heartburn: Secondary | ICD-10-CM

## 2024-01-21 DIAGNOSIS — R079 Chest pain, unspecified: Secondary | ICD-10-CM

## 2024-01-21 NOTE — Progress Notes (Signed)
   Subjective: Chest pain    Patient ID: Todd Cherry, male    DOB: 1996/02/08, 28 y.o.   MRN: [id number]  HPI Patient has a history of intermitting indigestion and reflux.  No maintenance medication for complaints since that extremity.  Patient that this morning he had increased pain in chest lasted about 10 minutes.  Patient thought he might be having a heart attack and took aspirin.  Patient states pain resolved in 10 minutes and he did not seek 911 or ED evaluation.  Incident occurred this morning and patient is completely asymptomatic except for anxiety.  Patient has antiacids medication and Pepcid at home. Review of Systems Hyperlipidemia    Objective:   Physical Exam P 109/82  Pulse Rate 62  Temp 97.8 F (36.6 C)  Weight 180 lb (81.6 kg)  Resp 14  SpO2 99 %   BMI: 25.10 kg/m2  BSA: 2.02 m2  EKG reveals sinus bradycardia.       Assessment & Plan: GERD  Advised patient Consider taking a PPI on a daily basis.  Have a medicine on hand.  Discussed no acute findings on EKG and advised to follow-up if condition worsens.

## 2024-01-21 NOTE — Progress Notes (Signed)
 Stated has indigestion and acid refux which is intermittent and random, but doesn't take med daily.  Stated hx elevation of BP and normal at this time.  Said this morning had increase pain of chest that only lasted about 10 minutes and had concerns.  Has never had this happen before and no further today.  Since it resolved he didn't seek 911 or ED and feels fine since then.  Denies anxiety or stress.  Both parents alive and no MI that he aware in their history, but other relatives had it.  He is an Designer, television/film set at [employer].  Current with physical with [facility].

## 2024-05-20 ENCOUNTER — Other Ambulatory Visit: Payer: Self-pay

## 2024-07-31 ENCOUNTER — Encounter
